# Patient Record
Sex: Female | Born: 1965 | Race: White | Hispanic: No | Marital: Married | Smoking: Former smoker
Health system: Southern US, Community
[De-identification: ages and names within clinical notes are randomized; demographics above are authoritative.]

## PROBLEM LIST (undated history)

## (undated) DIAGNOSIS — K645 Perianal venous thrombosis: Secondary | ICD-10-CM

## (undated) DIAGNOSIS — F909 Attention-deficit hyperactivity disorder, unspecified type: Secondary | ICD-10-CM

## (undated) DIAGNOSIS — F988 Other specified behavioral and emotional disorders with onset usually occurring in childhood and adolescence: Secondary | ICD-10-CM

## (undated) DIAGNOSIS — K602 Anal fissure, unspecified: Secondary | ICD-10-CM

## (undated) HISTORY — PX: LAPAROSCOPY: SHX197

## (undated) HISTORY — DX: Anal fissure, unspecified: K60.2

## (undated) HISTORY — PX: ABDOMINAL HYSTERECTOMY: SHX81

## (undated) HISTORY — DX: Perianal venous thrombosis: K64.5

---

## 2005-04-05 ENCOUNTER — Encounter: Admission: RE | Admit: 2005-04-05 | Discharge: 2005-04-05 | Payer: Self-pay | Admitting: Family Medicine

## 2006-03-19 ENCOUNTER — Other Ambulatory Visit: Admission: RE | Admit: 2006-03-19 | Discharge: 2006-03-19 | Payer: Self-pay | Admitting: Gynecology

## 2007-08-09 ENCOUNTER — Emergency Department (HOSPITAL_COMMUNITY): Admission: EM | Admit: 2007-08-09 | Discharge: 2007-08-09 | Payer: Self-pay | Admitting: Emergency Medicine

## 2009-01-21 HISTORY — PX: LAPAROSCOPY: SHX197

## 2009-08-02 ENCOUNTER — Ambulatory Visit: Payer: Self-pay | Admitting: Diagnostic Radiology

## 2009-08-02 ENCOUNTER — Emergency Department (HOSPITAL_BASED_OUTPATIENT_CLINIC_OR_DEPARTMENT_OTHER): Admission: EM | Admit: 2009-08-02 | Discharge: 2009-08-02 | Payer: Self-pay | Admitting: Emergency Medicine

## 2009-08-08 ENCOUNTER — Ambulatory Visit (HOSPITAL_COMMUNITY): Admission: RE | Admit: 2009-08-08 | Discharge: 2009-08-08 | Payer: Self-pay | Admitting: Obstetrics and Gynecology

## 2010-01-09 IMAGING — CT CT PELVIS W/ CM
2 of 4 series · 17 of 46 positions shown, 19 images · IV contrast (APPLIED)
Comparison: None

CLINICAL DATA: Pelvic pain.  Rectal bleeding.

CT PELVIS WITH CONTRAST
TECHNIQUE: Multidetector CT imaging of the pelvis was performed
following the standard protocol during administration of
intravenous contrast.
Contrast: 125 ml [id number]

[Series 2: abd_pel 5.0 b40f st · axial · 0.73mm/px · z∈[-476,-241]mm · 14 of 53 slices shown, 16 images]
[im 3/53  soft-tissue]
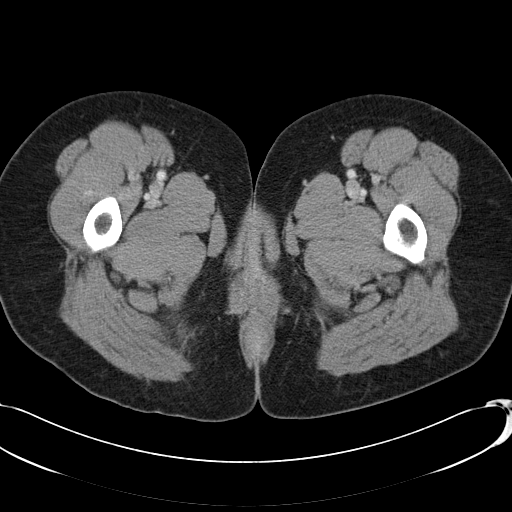
[im 3/53  bone]
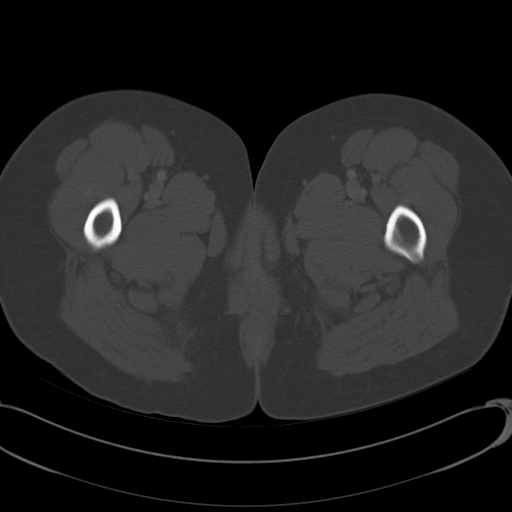
[im 8/53  soft-tissue]
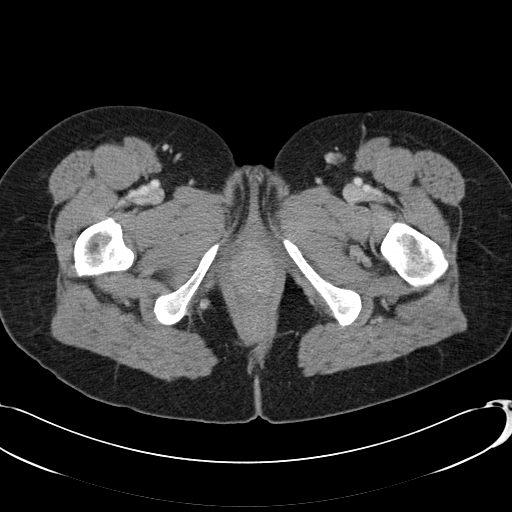
[im 11/53  soft-tissue]
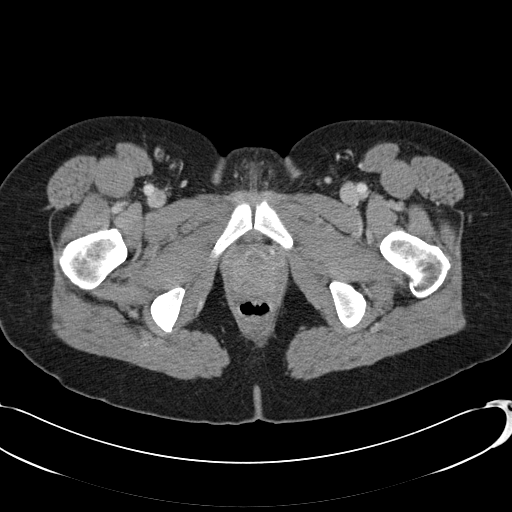
[im 14/53  soft-tissue]
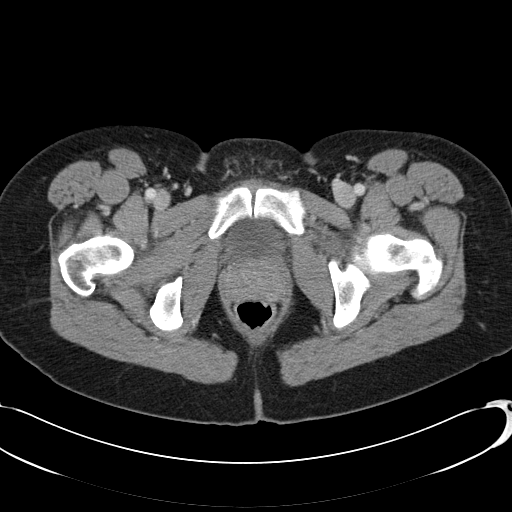
[im 19/53  soft-tissue]
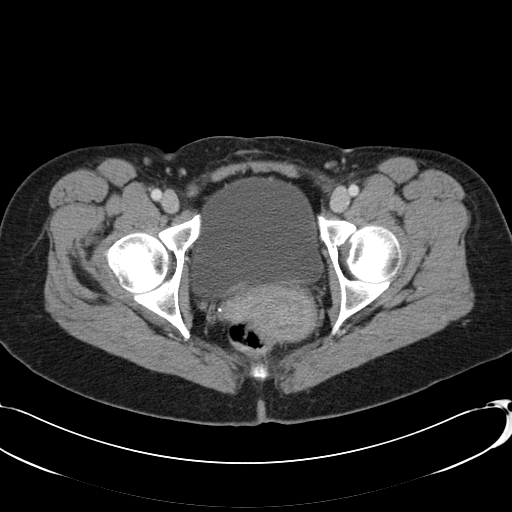
[im 21/53  soft-tissue]
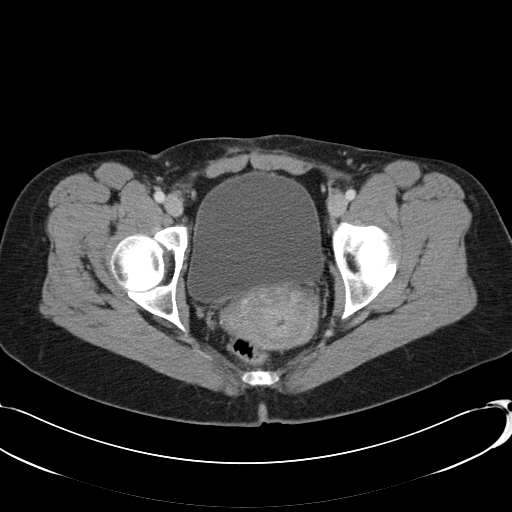
[im 24/53  soft-tissue]
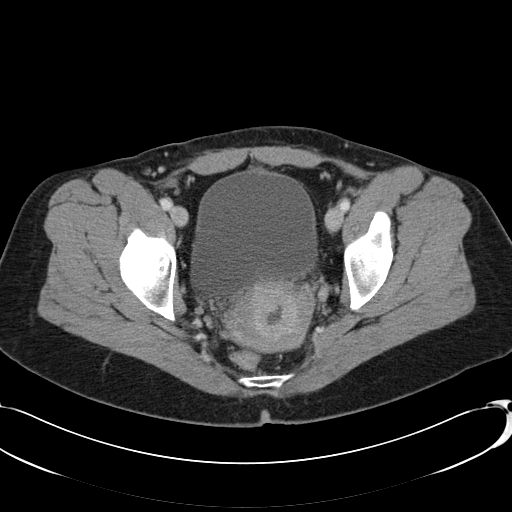
[im 29/53  soft-tissue]
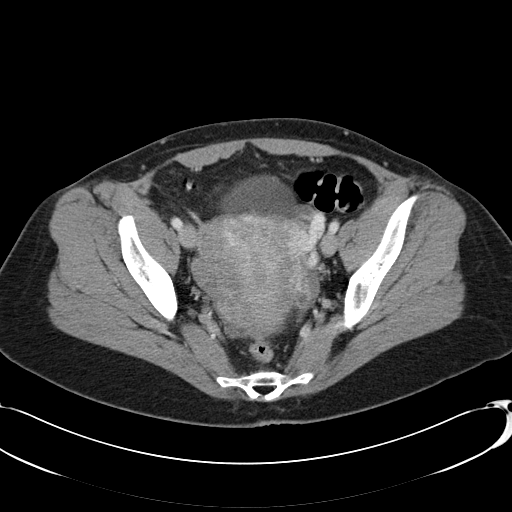
[im 32/53  soft-tissue]
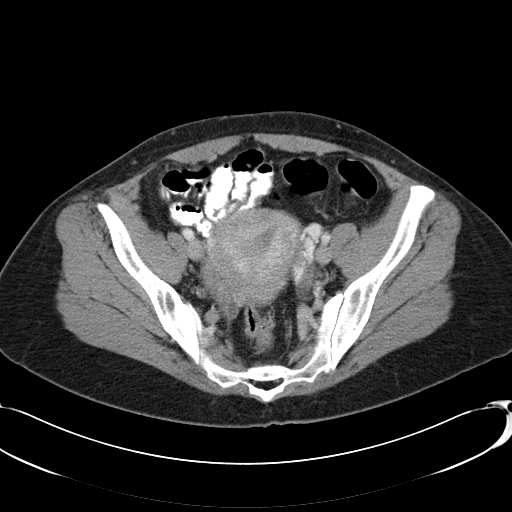
[im 32/53  bone]
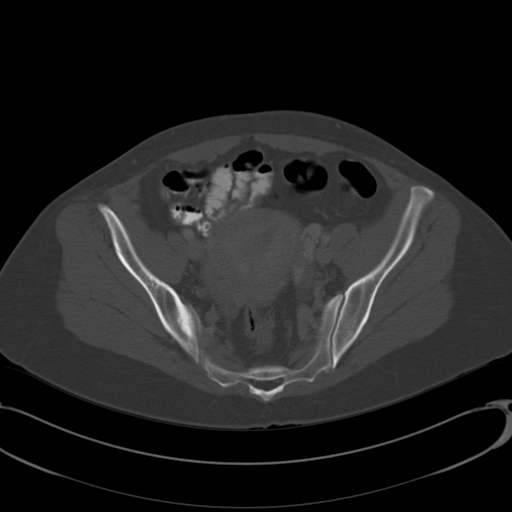
[im 34/53  soft-tissue]
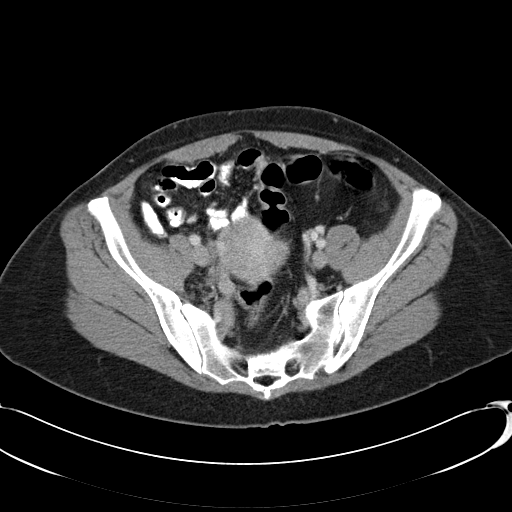
[im 40/53  soft-tissue]
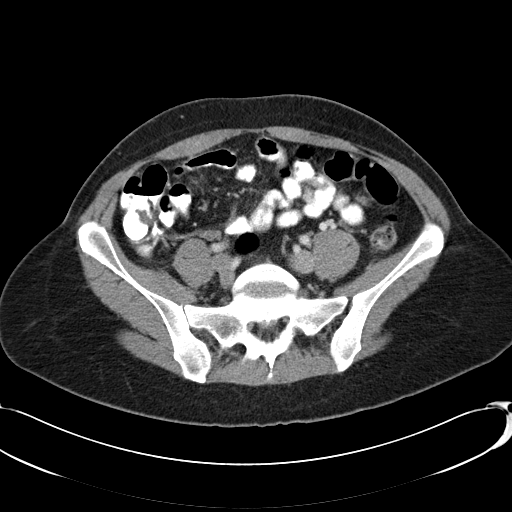
[im 42/53  soft-tissue]
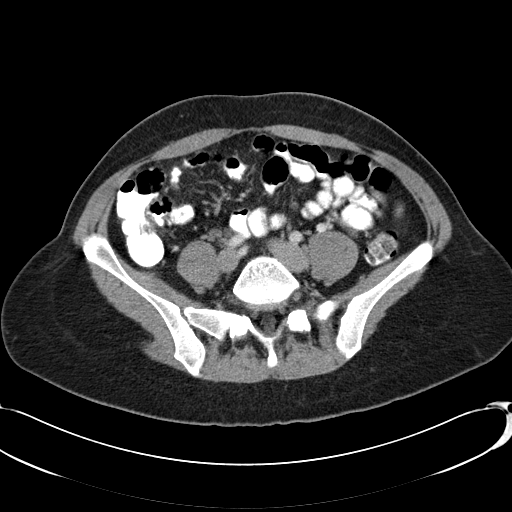
[im 45/53  soft-tissue]
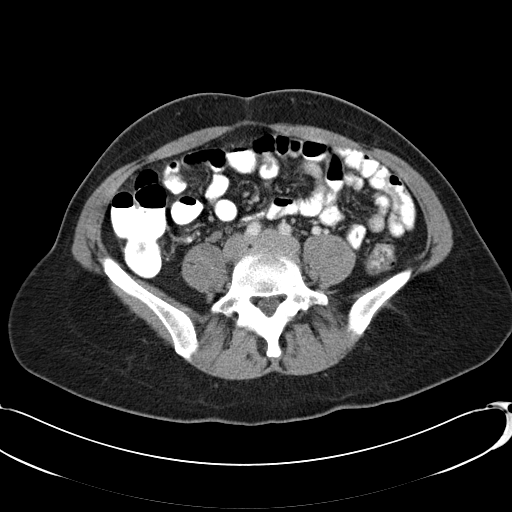
[im 50/53  soft-tissue]
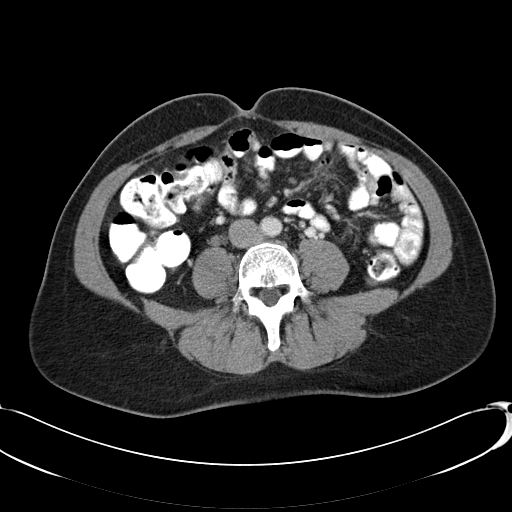

[Series 602: coronal · coronal · 0.73mm/px · 3 of 64 slices shown]
[im 22/64  soft-tissue]
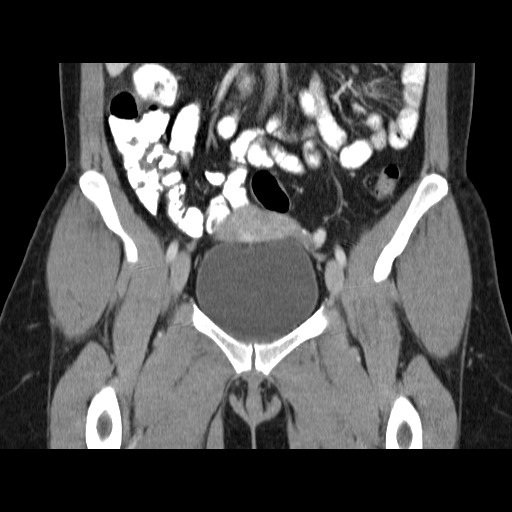
[im 29/64  soft-tissue]
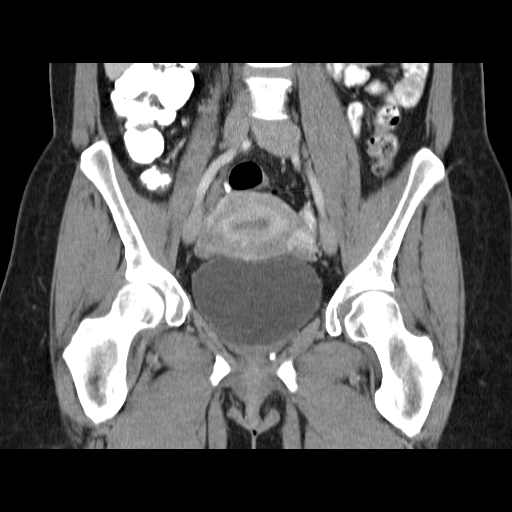
[im 36/64  soft-tissue]
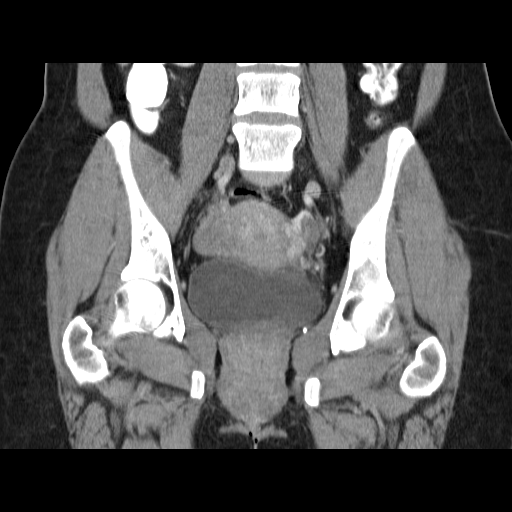

[17 of 46 positions shown; findings below may reference images not displayed]

FINDINGS: The rectum and sigmoid colon are unremarkable.  No
findings to suggest a

Bowl abscess or rectal mass.  The visualized small bowel loops
appear normal.  The uterus and ovaries are unremarkable.  The
bladder is normal.  A C-section scar is noted.  There is a small
amount of fluid in the endometrial canal and endocervical canal.

No pelvic adenopathy.  No significant free pelvic fluid
collections.  The bony pelvis is intact.  No inguinal masses or
hernia.
IMPRESSION: 1.  Unremarkable CT examination of the pelvis.

## 2010-02-16 ENCOUNTER — Encounter
Admission: RE | Admit: 2010-02-16 | Discharge: 2010-02-16 | Payer: Self-pay | Source: Home / Self Care | Attending: Obstetrics and Gynecology | Admitting: Obstetrics and Gynecology

## 2010-04-07 LAB — CBC
Hemoglobin: 12.6 g/dL (ref 12.0–15.0)
MCHC: 34.3 g/dL (ref 30.0–36.0)
WBC: 5.4 10*3/uL (ref 4.0–10.5)

## 2010-04-08 LAB — WET PREP, GENITAL: Trich, Wet Prep: NONE SEEN

## 2010-04-08 LAB — DIFFERENTIAL
Basophils Absolute: 0.1 10*3/uL (ref 0.0–0.1)
Lymphs Abs: 1.8 10*3/uL (ref 0.7–4.0)
Neutrophils Relative %: 74 % (ref 43–77)

## 2010-04-08 LAB — CBC
HCT: 38.2 % (ref 36.0–46.0)
Hemoglobin: 13.2 g/dL (ref 12.0–15.0)
MCHC: 34.5 g/dL (ref 30.0–36.0)
RDW: 11.7 % (ref 11.5–15.5)

## 2010-04-08 LAB — URINALYSIS, ROUTINE W REFLEX MICROSCOPIC
Bilirubin Urine: NEGATIVE
Glucose, UA: NEGATIVE mg/dL
Hgb urine dipstick: NEGATIVE
Protein, ur: NEGATIVE mg/dL
Specific Gravity, Urine: 1.005 (ref 1.005–1.030)
Urobilinogen, UA: 0.2 mg/dL (ref 0.0–1.0)
pH: 6.5 (ref 5.0–8.0)

## 2010-04-08 LAB — PREGNANCY, URINE: Preg Test, Ur: NEGATIVE

## 2010-09-03 ENCOUNTER — Encounter (HOSPITAL_COMMUNITY): Payer: Self-pay

## 2010-09-03 ENCOUNTER — Encounter (HOSPITAL_COMMUNITY)
Admission: RE | Admit: 2010-09-03 | Discharge: 2010-09-03 | Disposition: A | Discharge status: Home / Self Care | Payer: BC Managed Care – PPO | Source: Ambulatory Visit | Attending: Obstetrics and Gynecology | Admitting: Obstetrics and Gynecology

## 2010-09-03 HISTORY — DX: Attention-deficit hyperactivity disorder, unspecified type: F90.9

## 2010-09-03 LAB — CBC
HCT: 39.7 % (ref 36.0–46.0)
RBC: 4.3 MIL/uL (ref 3.87–5.11)
WBC: 6.6 10*3/uL (ref 4.0–10.5)

## 2010-09-03 LAB — SURGICAL PCR SCREEN
MRSA, PCR: NEGATIVE
Staphylococcus aureus: POSITIVE — AB

## 2010-09-03 NOTE — H&P (Signed)
  Meredith [name]  DICTATION # [id number] CSN# [id number]   [name], MD 09/03/2010 1:21 PM

## 2010-09-03 NOTE — Anesthesia Preprocedure Evaluation (Signed)
Anesthesia Evaluation  Name, MR# and DOB Patient awake  General Assessment Comment  Reviewed: Allergy & Precautions, H&P , Patient's Chart, lab work & pertinent test results, reviewed documented beta blocker date and time   History of Anesthesia Complications Negative for: history of anesthetic complications  Airway Mallampati: II TM Distance: >3 FB Neck ROM: full    Dental No notable dental hx.    Pulmonary  clear to auscultation  pulmonary exam normalPulmonary Exam Normal breath sounds clear to auscultation none    Cardiovascular Exercise Tolerance: Good regular Normal    Neuro/Psych Negative Neurological ROS  Negative Psych ROS  GI/Hepatic/Renal negative GI ROS, negative Liver ROS, and negative Renal ROS (+)       Endo/Other  Negative Endocrine ROS (+)      Abdominal   Musculoskeletal   Hematology negative hematology ROS (+)   Peds  Reproductive/Obstetrics negative OB ROS    Anesthesia Other Findings ADHD on adderall            Anesthesia Physical Anesthesia Plan  ASA: II  Anesthesia Plan: General   Post-op Pain Management:    Induction:   Airway Management Planned:   Additional Equipment:   Intra-op Plan:   Post-operative Plan:   Informed Consent: I have reviewed the patients History and Physical, chart, labs and discussed the procedure including the risks, benefits and alternatives for the proposed anesthesia with the patient or authorized representative who has indicated his/her understanding and acceptance.   Dental Advisory Given  Plan Discussed with: CRNA and Surgeon  Anesthesia Plan Comments:         Anesthesia Quick Evaluation

## 2010-09-03 NOTE — Patient Instructions (Signed)
  Amilliana Hayworth Your procedure is scheduled ZO:[id number] 09/06/10 Enter through the Main Entrance of [facility] at:6 AM Pick up the phone a the desk and dial [phone] Please call this number if you have any problems the morning of surgery: [phone]  Remember: Do not eat food after midnight  Do not drink clear liquids after:midnight Take these medicines the morning of surgery with a SIP OF WATER:none Do not wear jewelry, make-up, or nail polish Do not wear lotions, powders, or perfumes. Do not shave 48 hours prior to surgery. Do not bring valuables to the hospital. Leave suitcase in the car. After Surgery it may be brought to your room. For patients being admitted to the hospital, checkout time is 11:00am the day of discharge.  Please read over the Hibiclens Guide to General Skin Cleansing at Home. Remember that hibiclens is not to be used on the head, face, or vaginal area. Please shower with 1/2 bottle the evening before surgery and other 1/2 bottle morning of surgery prior to arrival at hospital.  Do not use any deodorants, lotions, or powders after hibiclens shower.

## 2010-09-04 NOTE — H&P (Signed)
[name], [name]                ACCOUNT NO.:  [id number]  MEDICAL RECORD NO.:  [id number]  LOCATION:                                 FACILITY:  PHYSICIAN:  Duke Salvia. Marcelle Overlie, M.D.DATE OF BIRTH:  [birth year]  DATE OF ADMISSION:  09/06/2010 DATE OF DISCHARGE:                             HISTORY & PHYSICAL   Date of scheduled surgery is September 06, 2010.  CHIEF COMPLAINT:  Menorrhagia, stress incontinence.  HISTORY OF PRESENT ILLNESS:  A 45 year old G3, P2, two prior C-sections has 37-year-old twins and a 10-year-old, prior tubal ligation.  Last year she had laparoscopy to evaluate pelvic pain, had some minimal adhesions, possible adenomyosis with some minimal endometriosis noted.  Due to concerns with menorrhagia and stress urinary incontinence, she presents for LAVH with Solyx single incision sling.  She has had preoperative urodynamics consistent with SUI.  The procedure of LAVH including specific risks related to bleeding, infection, transfusion, adjacent organ injury, the possible need to complete the surgery by open technique all reviewed.  Additionally, the small risk of mesh erosion other risks from the sling procedure including adjacent organ injury, the need for catheterization postoperatively reviewed with her which she understands and accepts.  PAST MEDICAL HISTORY:  None.  ALLERGIES:  None.  CURRENT MEDICATIONS:  Adderall.  OBSTETRICAL HISTORY:  Twins in 2004, another delivery in 2005, both by C- sections previous laparoscopy last year is noted.  FAMILY HISTORY:  She is adopted.  SOCIAL HISTORY:  Denies smoking or alcohol use.  She is married.  PHYSICAL EXAM:  VITAL SIGNS:  Temperature 98.2, blood pressure 120/82. HEENT:  Unremarkable. NECK:  Supple without masses. LUNGS:  Clear. CARDIOVASCULAR:  Rate and rhythm without murmurs, rubs, or gallops. BREASTS:  Without masses. ABDOMEN:  Soft, flat, and nontender. PELVIC:  Normal external genitalia.  Vagina and  cervix clear.  Uterus upper limit of normal size, mid position, mobile.  Adnexa negative. EXTREMITIES:  Unremarkable. NEUROLOGIC:  Unremarkable.  IMPRESSION: 1. Stress urinary incontinence. 2. Menorrhagia, possible adenomyosis, minimal endometriosis prior     laparoscopy.  PLAN:  LAVH single incision, mid urethral sling procedure and risks reviewed as above.     Dominico Rod M. Marcelle Overlie, M.D.     [facility]/MEDQ  D:  09/03/2010  T:  09/03/2010  Job:  [id number]

## 2010-09-05 ENCOUNTER — Other Ambulatory Visit: Payer: Self-pay | Admitting: Obstetrics and Gynecology

## 2010-09-06 ENCOUNTER — Ambulatory Visit (HOSPITAL_COMMUNITY): Payer: BC Managed Care – PPO | Admitting: Certified Registered"

## 2010-09-06 ENCOUNTER — Ambulatory Visit (HOSPITAL_COMMUNITY)
Admission: RE | Admit: 2010-09-06 | Discharge: 2010-09-07 | Disposition: A | Discharge status: Home / Self Care | Payer: BC Managed Care – PPO | Source: Ambulatory Visit | Attending: Obstetrics and Gynecology | Admitting: Obstetrics and Gynecology

## 2010-09-06 ENCOUNTER — Encounter (HOSPITAL_COMMUNITY): Payer: Self-pay | Admitting: Certified Registered"

## 2010-09-06 ENCOUNTER — Other Ambulatory Visit: Payer: Self-pay | Admitting: Obstetrics and Gynecology

## 2010-09-06 ENCOUNTER — Encounter (HOSPITAL_COMMUNITY)
Admission: RE | Disposition: A | Discharge status: Home / Self Care | Payer: Self-pay | Source: Ambulatory Visit | Attending: Obstetrics and Gynecology

## 2010-09-06 ENCOUNTER — Encounter (HOSPITAL_COMMUNITY): Payer: Self-pay | Admitting: Anesthesiology

## 2010-09-06 DIAGNOSIS — Z01818 Encounter for other preprocedural examination: Secondary | ICD-10-CM | POA: Insufficient documentation

## 2010-09-06 DIAGNOSIS — Z01812 Encounter for preprocedural laboratory examination: Secondary | ICD-10-CM | POA: Insufficient documentation

## 2010-09-06 DIAGNOSIS — N393 Stress incontinence (female) (male): Secondary | ICD-10-CM | POA: Insufficient documentation

## 2010-09-06 DIAGNOSIS — N920 Excessive and frequent menstruation with regular cycle: Secondary | ICD-10-CM | POA: Insufficient documentation

## 2010-09-06 HISTORY — PX: BLADDER SUSPENSION: SHX72

## 2010-09-06 HISTORY — PX: LAPAROSCOPIC ASSISTED VAGINAL HYSTERECTOMY: SHX5398

## 2010-09-06 SURGERY — HYSTERECTOMY, VAGINAL, LAPAROSCOPY-ASSISTED
Anesthesia: General | Site: Vagina | Laterality: Bilateral | Wound class: Clean Contaminated

## 2010-09-06 MED ORDER — FENTANYL CITRATE 0.05 MG/ML IJ SOLN
INTRAMUSCULAR | Status: AC
  Filled 2010-09-06: qty 5

## 2010-09-06 MED ORDER — KETOROLAC TROMETHAMINE 30 MG/ML IJ SOLN: 30.0000 mg | Freq: Once | INTRAMUSCULAR | Status: DC

## 2010-09-06 MED ORDER — KETOROLAC TROMETHAMINE 30 MG/ML IJ SOLN
30.0000 mg | Freq: Four times a day (QID) | INTRAMUSCULAR | Status: DC
  Filled 2010-09-06: qty 1

## 2010-09-06 MED ORDER — FENTANYL CITRATE 0.05 MG/ML IJ SOLN
INTRAMUSCULAR | Status: DC | PRN
  Administered 2010-09-06: 100 ug via INTRAVENOUS
  Administered 2010-09-06: 50 ug via INTRAVENOUS
  Administered 2010-09-06: 100 ug via INTRAVENOUS

## 2010-09-06 MED ORDER — DIPHENHYDRAMINE HCL 12.5 MG/5ML PO ELIX: 12.5000 mg | ORAL_SOLUTION | Freq: Four times a day (QID) | ORAL | Status: DC | PRN

## 2010-09-06 MED ORDER — ONDANSETRON HCL 4 MG/2ML IJ SOLN
INTRAMUSCULAR | Status: DC | PRN
  Administered 2010-09-06 (×2): 4 mg via INTRAVENOUS

## 2010-09-06 MED ORDER — PHENYLEPHRINE HCL 10 MG/ML IJ SOLN
INTRAMUSCULAR | Status: DC | PRN
  Administered 2010-09-06: 40 ug via INTRAVENOUS
  Administered 2010-09-06: 80 ug via INTRAVENOUS
  Administered 2010-09-06: 40 ug via INTRAVENOUS

## 2010-09-06 MED ORDER — SIMETHICONE 80 MG PO CHEW: 80.0000 mg | CHEWABLE_TABLET | Freq: Four times a day (QID) | ORAL | Status: DC | PRN

## 2010-09-06 MED ORDER — DEXAMETHASONE SODIUM PHOSPHATE 10 MG/ML IJ SOLN
INTRAMUSCULAR | Status: AC
  Filled 2010-09-06: qty 1

## 2010-09-06 MED ORDER — MORPHINE SULFATE 10 MG/ML IJ SOLN
INTRAMUSCULAR | Status: AC
  Filled 2010-09-06: qty 1

## 2010-09-06 MED ORDER — CEFAZOLIN SODIUM 1-5 GM-% IV SOLN
1.0000 g | INTRAVENOUS | Status: AC
  Administered 2010-09-06: 1 g via INTRAVENOUS

## 2010-09-06 MED ORDER — MIDAZOLAM HCL 2 MG/2ML IJ SOLN
INTRAMUSCULAR | Status: AC
  Filled 2010-09-06: qty 2

## 2010-09-06 MED ORDER — LIDOCAINE HCL (PF) 2 % IJ SOLN
INTRAMUSCULAR | Status: DC | PRN
  Administered 2010-09-06: 60 mg

## 2010-09-06 MED ORDER — MORPHINE SULFATE (PF) 1 MG/ML IV SOLN
INTRAVENOUS | Status: DC
  Administered 2010-09-06: 25 mg via INTRAVENOUS
  Administered 2010-09-06: 12 mL via INTRAVENOUS
  Administered 2010-09-06: 15 mg via INTRAVENOUS
  Administered 2010-09-06: 25 mg via INTRAVENOUS
  Administered 2010-09-06: 11 mL via INTRAVENOUS
  Filled 2010-09-06 (×2): qty 25

## 2010-09-06 MED ORDER — BUPIVACAINE HCL (PF) 0.25 % IJ SOLN
INTRAMUSCULAR | Status: DC | PRN
  Administered 2010-09-06: 9 mL

## 2010-09-06 MED ORDER — DEXAMETHASONE SODIUM PHOSPHATE 10 MG/ML IJ SOLN
INTRAMUSCULAR | Status: DC | PRN
  Administered 2010-09-06: 4 mg via INTRAVENOUS

## 2010-09-06 MED ORDER — NEOSTIGMINE METHYLSULFATE 1 MG/ML IJ SOLN
INTRAMUSCULAR | Status: AC
  Filled 2010-09-06: qty 10

## 2010-09-06 MED ORDER — LACTATED RINGERS IR SOLN
Status: DC | PRN
  Administered 2010-09-06: 3000 mL

## 2010-09-06 MED ORDER — MEPERIDINE HCL 25 MG/ML IJ SOLN
INTRAMUSCULAR | Status: AC
  Administered 2010-09-06: 6.25 mg via INTRAVENOUS
  Filled 2010-09-06: qty 1

## 2010-09-06 MED ORDER — IBUPROFEN 200 MG PO TABS: 200.0000 mg | ORAL_TABLET | Freq: Four times a day (QID) | ORAL | Status: DC | PRN

## 2010-09-06 MED ORDER — ROCURONIUM BROMIDE 100 MG/10ML IV SOLN
INTRAVENOUS | Status: DC | PRN
  Administered 2010-09-06: 40 mg via INTRAVENOUS

## 2010-09-06 MED ORDER — MENTHOL 3 MG MT LOZG: 1.0000 | LOZENGE | OROMUCOSAL | Status: DC | PRN

## 2010-09-06 MED ORDER — LIDOCAINE-EPINEPHRINE 1 %-1:100000 IJ SOLN
INTRAMUSCULAR | Status: DC | PRN
  Administered 2010-09-06: 3 mL via INTRADERMAL

## 2010-09-06 MED ORDER — DEXTROSE IN LACTATED RINGERS 5 % IV SOLN
INTRAVENOUS | Status: DC
  Administered 2010-09-06 – 2010-09-07 (×2): via INTRAVENOUS

## 2010-09-06 MED ORDER — SUCCINYLCHOLINE CHLORIDE 20 MG/ML IJ SOLN
INTRAMUSCULAR | Status: AC
  Filled 2010-09-06: qty 1

## 2010-09-06 MED ORDER — BUTORPHANOL TARTRATE 2 MG/ML IJ SOLN: 1.0000 mg | INTRAMUSCULAR | Status: DC | PRN

## 2010-09-06 MED ORDER — KETOROLAC TROMETHAMINE 30 MG/ML IJ SOLN
30.0000 mg | Freq: Four times a day (QID) | INTRAMUSCULAR | Status: DC
  Administered 2010-09-06 – 2010-09-07 (×4): 30 mg via INTRAVENOUS
  Filled 2010-09-06 (×3): qty 1

## 2010-09-06 MED ORDER — HYDROMORPHONE HCL 1 MG/ML IJ SOLN
INTRAMUSCULAR | Status: AC
  Administered 2010-09-06: 0.5 mg via INTRAVENOUS
  Filled 2010-09-06: qty 1

## 2010-09-06 MED ORDER — PROPOFOL 10 MG/ML IV EMUL
INTRAVENOUS | Status: AC
  Filled 2010-09-06: qty 20

## 2010-09-06 MED ORDER — OXYCODONE-ACETAMINOPHEN 5-325 MG PO TABS: 1.0000 | ORAL_TABLET | ORAL | Status: DC | PRN

## 2010-09-06 MED ORDER — STERILE WATER FOR IRRIGATION IR SOLN
Status: DC | PRN
  Administered 2010-09-06: 200 mL

## 2010-09-06 MED ORDER — ROCURONIUM BROMIDE 50 MG/5ML IV SOLN
INTRAVENOUS | Status: AC
  Filled 2010-09-06: qty 1

## 2010-09-06 MED ORDER — NALOXONE HCL 0.4 MG/ML IJ SOLN: 0.4000 mg | INTRAMUSCULAR | Status: DC | PRN

## 2010-09-06 MED ORDER — DEXTROSE IN LACTATED RINGERS 5 % IV SOLN: INTRAVENOUS | Status: DC

## 2010-09-06 MED ORDER — ONDANSETRON HCL 4 MG/2ML IJ SOLN
INTRAMUSCULAR | Status: AC
  Filled 2010-09-06: qty 2

## 2010-09-06 MED ORDER — GLYCOPYRROLATE 0.2 MG/ML IJ SOLN
INTRAMUSCULAR | Status: DC | PRN
  Administered 2010-09-06: .15 mg via INTRAVENOUS
  Administered 2010-09-06: .5 mg via INTRAVENOUS

## 2010-09-06 MED ORDER — MEPERIDINE HCL 25 MG/ML IJ SOLN
6.2500 mg | INTRAMUSCULAR | Status: DC | PRN
  Administered 2010-09-06 (×2): 6.25 mg via INTRAVENOUS

## 2010-09-06 MED ORDER — SODIUM CHLORIDE 0.9 % IJ SOLN
INTRAMUSCULAR | Status: DC | PRN
  Administered 2010-09-06: 10 mL

## 2010-09-06 MED ORDER — MIDAZOLAM HCL 5 MG/5ML IJ SOLN
INTRAMUSCULAR | Status: DC | PRN
  Administered 2010-09-06: 2 mg via INTRAVENOUS

## 2010-09-06 MED ORDER — ONDANSETRON HCL 4 MG/2ML IJ SOLN: 4.0000 mg | Freq: Once | INTRAMUSCULAR | Status: DC | PRN

## 2010-09-06 MED ORDER — DIPHENHYDRAMINE HCL 50 MG/ML IJ SOLN: 12.5000 mg | Freq: Four times a day (QID) | INTRAMUSCULAR | Status: DC | PRN

## 2010-09-06 MED ORDER — IBUPROFEN 800 MG PO TABS: 800.0000 mg | ORAL_TABLET | Freq: Three times a day (TID) | ORAL | Status: DC | PRN

## 2010-09-06 MED ORDER — BISACODYL 10 MG RE SUPP: 10.0000 mg | Freq: Every day | RECTAL | Status: DC | PRN

## 2010-09-06 MED ORDER — LACTATED RINGERS IV SOLN
INTRAVENOUS | Status: DC
  Administered 2010-09-06: 50 mL/h via INTRAVENOUS
  Administered 2010-09-06 (×2): via INTRAVENOUS

## 2010-09-06 MED ORDER — NEOSTIGMINE METHYLSULFATE 1 MG/ML IJ SOLN
INTRAMUSCULAR | Status: DC | PRN
  Administered 2010-09-06: 3 mg via INTRAMUSCULAR

## 2010-09-06 MED ORDER — HYDROMORPHONE HCL 1 MG/ML IJ SOLN
0.2500 mg | INTRAMUSCULAR | Status: DC | PRN
  Administered 2010-09-06 (×2): 0.5 mg via INTRAVENOUS

## 2010-09-06 MED ORDER — GLYCOPYRROLATE 0.2 MG/ML IJ SOLN
INTRAMUSCULAR | Status: AC
  Filled 2010-09-06: qty 1

## 2010-09-06 MED ORDER — LIDOCAINE HCL (CARDIAC) 20 MG/ML IV SOLN
INTRAVENOUS | Status: AC
  Filled 2010-09-06: qty 5

## 2010-09-06 MED ORDER — PHENYLEPHRINE 40 MCG/ML (10ML) SYRINGE FOR IV PUSH (FOR BLOOD PRESSURE SUPPORT)
PREFILLED_SYRINGE | INTRAVENOUS | Status: AC
  Filled 2010-09-06: qty 5

## 2010-09-06 MED ORDER — PROPOFOL 10 MG/ML IV EMUL
INTRAVENOUS | Status: DC | PRN
  Administered 2010-09-06: 160 mg via INTRAVENOUS
  Administered 2010-09-06: 40 mg via INTRAVENOUS

## 2010-09-06 MED ORDER — ZOLPIDEM TARTRATE 5 MG PO TABS
5.0000 mg | ORAL_TABLET | Freq: Every evening | ORAL | Status: DC | PRN
  Administered 2010-09-07: 5 mg via ORAL
  Filled 2010-09-06: qty 1

## 2010-09-06 MED ORDER — KETOROLAC TROMETHAMINE 30 MG/ML IJ SOLN
INTRAMUSCULAR | Status: DC | PRN
  Administered 2010-09-06: 30 mg via INTRAVENOUS

## 2010-09-06 SURGICAL SUPPLY — 54 items
BLADE SURG 11 STRL SS (BLADE) IMPLANT
BLADE SURG 15 STRL LF C SS BP (BLADE) ×2 IMPLANT
BLADE SURG 15 STRL SS (BLADE) ×1
CABLE HIGH FREQUENCY MONO STRZ (ELECTRODE) IMPLANT
CANISTER SUCTION 2500CC (MISCELLANEOUS) ×3 IMPLANT
CATH ROBINSON RED A/P 16FR (CATHETERS) ×3 IMPLANT
CLOTH BEACON ORANGE TIMEOUT ST (SAFETY) ×3 IMPLANT
CONT PATH 16OZ SNAP LID 3702 (MISCELLANEOUS) ×3 IMPLANT
COVER TABLE BACK 60X90 (DRAPES) ×3 IMPLANT
DECANTER SPIKE VIAL GLASS SM (MISCELLANEOUS) ×6 IMPLANT
DERMABOND ADVANCED (GAUZE/BANDAGES/DRESSINGS) ×6 IMPLANT
DRAPE CAMERA CLOSED 9X96 (DRAPES) ×3 IMPLANT
DRAPE HYSTEROSCOPY (DRAPE) ×3 IMPLANT
DRAPE UTILITY XL STRL (DRAPES) ×3 IMPLANT
DRSG COVADERM PLUS 2X2 (GAUZE/BANDAGES/DRESSINGS) ×3 IMPLANT
ELECT LIGASURE LONG (ELECTRODE) ×3 IMPLANT
ELECT REM PT RETURN 9FT ADLT (ELECTROSURGICAL) ×3
ELECTRODE REM PT RTRN 9FT ADLT (ELECTROSURGICAL) ×2 IMPLANT
GAUZE PACKING 1 X5 YD ST (GAUZE/BANDAGES/DRESSINGS) IMPLANT
GAUZE SPONGE 4X4 16PLY XRAY LF (GAUZE/BANDAGES/DRESSINGS) ×6 IMPLANT
GENERIC IMPLANT FOR PICIS (MISCELLANEOUS) ×3 IMPLANT
GLOVE BIO SURGEON STRL SZ7 (GLOVE) ×9 IMPLANT
GLOVE BIOGEL PI IND STRL 6.5 (GLOVE) ×2 IMPLANT
GLOVE BIOGEL PI INDICATOR 6.5 (GLOVE) ×1
GOWN PREVENTION PLUS LG XLONG (DISPOSABLE) ×12 IMPLANT
NEEDLE HYPO 25X1 1.5 SAFETY (NEEDLE) ×3 IMPLANT
NEEDLE INSUFFLATION 14GA 120MM (NEEDLE) ×3 IMPLANT
NS IRRIG 1000ML POUR BTL (IV SOLUTION) ×3 IMPLANT
PACK LAVH (CUSTOM PROCEDURE TRAY) ×3 IMPLANT
PACK VAGINAL WOMENS (CUSTOM PROCEDURE TRAY) ×3 IMPLANT
SEALER TISSUE G2 CVD JAW 45CM (ENDOMECHANICALS) ×3 IMPLANT
SET CYSTO W/LG BORE CLAMP LF (SET/KITS/TRAYS/PACK) ×3 IMPLANT
SET IRRIG TUBING LAPAROSCOPIC (IRRIGATION / IRRIGATOR) ×3 IMPLANT
SLING SOLYX SYSTEM SIS EA (Sling) ×3 IMPLANT
SOLUTION ELECTROLUBE (MISCELLANEOUS) IMPLANT
STRIP CLOSURE SKIN 1/4X3 (GAUZE/BANDAGES/DRESSINGS) IMPLANT
SUT MON AB 2-0 CT1 36 (SUTURE) ×9 IMPLANT
SUT VIC AB 0 CT1 18XCR BRD8 (SUTURE) ×4 IMPLANT
SUT VIC AB 0 CT1 8-18 (SUTURE) ×2
SUT VIC AB 2-0 CT1 27 (SUTURE) ×2
SUT VIC AB 2-0 CT1 TAPERPNT 27 (SUTURE) ×4 IMPLANT
SUT VIC AB 2-0 SH 27 (SUTURE) ×1
SUT VIC AB 2-0 SH 27XBRD (SUTURE) ×2 IMPLANT
SUT VIC AB 2-0 UR6 27 (SUTURE) ×6 IMPLANT
SUT VICRYL 0 TIES 12 18 (SUTURE) ×3 IMPLANT
SUT VICRYL 4-0 PS2 18IN ABS (SUTURE) ×6 IMPLANT
SYR CONTROL 10ML LL (SYRINGE) ×6 IMPLANT
TOWEL OR 17X24 6PK STRL BLUE (TOWEL DISPOSABLE) ×6 IMPLANT
TRAY FOLEY CATH 14FR (SET/KITS/TRAYS/PACK) ×3 IMPLANT
TROCAR Z-THREAD BLADED 11X100M (TROCAR) ×3 IMPLANT
TROCAR Z-THREAD BLADED 5X100MM (TROCAR) ×6 IMPLANT
TUBING NON-CON 1/4 X 20 CONN (TUBING) ×3 IMPLANT
WARMER LAPAROSCOPE (MISCELLANEOUS) ×3 IMPLANT
WATER STERILE IRR 1000ML POUR (IV SOLUTION) ×3 IMPLANT

## 2010-09-06 NOTE — Anesthesia Postprocedure Evaluation (Signed)
Anesthesia Post Note  Patient: Meredith [name]  Procedure(s) Performed:  LAPAROSCOPIC ASSISTED VAGINAL HYSTERECTOMY - abdomen/vagina; TRANSVAGINAL TAPE (TVT) PROCEDURE - solyx  Anesthesia type: General  Patient location: PACU  Post pain: Pain level controlled  Post assessment: Post-op Vital signs reviewed  Last Vitals:  Filed Vitals:   09/06/10 0915  BP: 90/71  Pulse: 55  Temp:   Resp: 12    Post vital signs: Reviewed  Level of consciousness: sedated  Complications: No apparent anesthesia complicationsfj

## 2010-09-06 NOTE — Progress Notes (Signed)
No change in status.

## 2010-09-06 NOTE — Preoperative (Signed)
Beta Blockers   Reason not to administer Beta Blockers:Not Applicable 

## 2010-09-06 NOTE — Transfer of Care (Signed)
Immediate Anesthesia Transfer of Care Note  Patient: Meredith [name]  Procedure(s) Performed:  LAPAROSCOPIC ASSISTED VAGINAL HYSTERECTOMY - abdomen/vagina; TRANSVAGINAL TAPE (TVT) PROCEDURE - solyx  Patient Location: PACU  Anesthesia Type: General  Level of Consciousness: awake, alert  and oriented  Airway & Oxygen Therapy: Patient Spontanous Breathing and Patient connected to nasal cannula oxygen  Post-op Assessment: Report given to PACU RN and Post -op Vital signs reviewed and stable  Post vital signs: Reviewed and stable  Complications: No apparent anesthesia complications

## 2010-09-06 NOTE — OR Nursing (Signed)
Solyx sling implanted @ midurethral position in OR suite per Dr. Marcelle Overlie. Serial # [id number]. Exp. Date 11/21/12.

## 2010-09-06 NOTE — Progress Notes (Signed)
Alert + conversant, clear UOP, abd benign, reviewed procedure.

## 2010-09-06 NOTE — Op Note (Signed)
Preoperative diagnosis: 1. Menorrhagia, dysmenorrhea 2. Stress urinary incontinence. 3. Telemetry can endometriosis.  Postoperative diagnosis: Same  Procedure: LAVH, silex, single incision mid urethral sling, cystoscopy.  Surgeon: Marcelle Overlie  Assistant: Huntley Dec  EBL: 200 cc.  Specimens removed: Uterus to pathology  Complications: None  Procedure and findings:  Patient was taken to the operating room, after an adequate level of general endotracheal anesthesia was obtained with the patient in the supine position, legs in stirrups the abdomen perineum and vagina were prepped and draped in the usual manner for LAVH. In and out catheter into the bladder, uterus midposition normal size mobile adnexa negative. Holter tenaculum was positioned. Attention directed to the abdomen where 2 cm subumbilical incision was made after infiltrating with quarter percent Marcaine plain. The varies needle was introduced without difficulty. Its intra-abdominal position was verified by pressure water testing. After a 2 and half liters pneumoperitoneum was then created, laparoscopic trocar and sleeve were then introduced without difficulty. There was no evidence of any bleeding or trauma.  3 finger breaths above the symphysis in the midline a 5 mm trocar was inserted under direct visualization. The patient in Trendelenburg and the uterus anteflexed the pelvic findings as follows:   The uterus itself was normal size there were some spotty endometriosis on the posterior surface of the uterus bilateral adnexa were unremarkable the appendix liver and gallbladder appeared to be no abnormal no other abnormalities were noted. Starting on the right using the in seal device the utero-ovarian pedicle was coagulated and divided down to and including the round ligament on each side with excellent hemostasis. This point the vaginal portion of the procedure was started.  The legs were extended weighted speculum was positioned cervix  grasped with a tenaculum the cervical vaginal mucosa was coagulated and cut with the Bovie posterior culdotomy performed without difficulty. The bladder was advanced superiorly with sharp and blunt dissection until the anterior peritoneal reflection could be identified this was entered sharply and a retractor then used to gently elevate the bladder out of the field. The LigaSure device was then used to coagulate and divide the uterosacral ligament close to the uterus, cardinal ligament, uterine vasculature pedicles and upper broad ligament pedicles. The fundus of the uterus was then delivered posteriorly remaining pedicles coagulated and divided. The vaginal cuff was then closed from right to left with a 2-0 Monocryl running locked suture. Prior to closure sponge needle history counts reported as correct x2 vaginal mucosa closed right to left with interrupted 2-0 Monocryl sutures. At that point the mid urethral sling portion the procedure was started Allis clamps were then used to grasp the mucosa over the mid urethra which was injected with 1% Xylocaine with epi. A 2 cm vertical incision was then made using minimal Metzenbaum dissection and blunt finger dissection the space was developed until the posterior portion of the inferior pubic ramus could be palpated on each side. The psoas was then anchored on the right at 45 angle to the midway point of the same on the left side, final tensioning based on a hemostat positioned at the mid point. Once this was Iker cystoscopy was carried out revealing normal bladder findings the ureter was observed on the way out and noted to be normal. Foley catheter positioned draining clear urine the vaginal mucosa closed with 2-0 Vicryl interrupted sutures. Repeat laparoscopy carried out at that point with nasi irrigation revealing the operative site to be hemostatic even at reduced pressure. This was removed, gas allowed to escape  defect closed with 4-0 subcuticular suture and  Dermabond on the lower incision she tolerated this well went to recovery in good condition.  Dictated with [employer], not proofread Traylen Eckels M. Milana Obey.D.

## 2010-09-07 LAB — CBC
MCH: 30.7 pg (ref 26.0–34.0)
MCHC: 33.4 g/dL (ref 30.0–36.0)
Platelets: 156 10*3/uL (ref 150–400)
RBC: 3.26 MIL/uL — ABNORMAL LOW (ref 3.87–5.11)

## 2010-09-07 MED ORDER — IBUPROFEN 800 MG PO TABS: 800.0000 mg | ORAL_TABLET | Freq: Three times a day (TID) | ORAL | Status: DC | PRN

## 2010-09-07 MED ORDER — OXYCODONE-ACETAMINOPHEN 5-325 MG PO TABS: 1.0000 | ORAL_TABLET | ORAL | Status: DC | PRN

## 2010-09-07 NOTE — Progress Notes (Signed)
Pt was out of bed at bedside, moderate amount drainage noted at umbilical area serousangious drainage.  Placed pt to bed and pressure dressing applied.  Vital signs stable.

## 2010-09-07 NOTE — Progress Notes (Signed)
Scant amount drainage noted on pressure dressing, another pressure dressing applied to umbilical area, slight increased bruising noted at this time

## 2010-09-07 NOTE — Progress Notes (Signed)
POD #1 S/ no c/o, ready for D/c O/afeb, bruising @ inc., but dry  A/doing well, P/ D/ c today>>office 7-10 days

## 2010-09-07 NOTE — Discharge Summary (Signed)
Physician Discharge Summary  Patient ID: Meredith [name] MRN: [id number] DOB/AGE: 1965-07-24 44 y.o.  Admit date: 09/06/2010 Discharge date: 09/07/2010  Admission Diagnoses:1. Menorrhaghia 2.  SUI Discharge Diagnoses: same Active Problems:  * No active hospital problems. *    Discharged Condition: good  Hospital Course: LAVH + midurethral sling 8/17.  POD # 1, pt afeb., inc C/D, voiding satiisf.  Consults: none  Significant Diagnostic Studies: labs:    Treatments: surgery: LAVH/Solyx sling  Discharge Exam: Blood pressure 91/56, pulse 75, temperature 97.9 F (36.6 C), temperature source Oral, resp. rate 18, last menstrual period 08/30/2010, SpO2 96.00%. bruising at inc, but otherwise C/D  Disposition: Final discharge disposition not confirmed   Current Discharge Medication List    START taking these medications   Details  ibuprofen (ADVIL,MOTRIN) 800 MG tablet Take 1 tablet (800 mg total) by mouth every 8 (eight) hours as needed for pain (mild pain). Qty: 30 tablet, Refills: 2    oxyCODONE-acetaminophen (PERCOCET) 5-325 MG per tablet Take 1-2 tablets by mouth every 3 (three) hours as needed (moderate to severe pain (when tolerating fluids)). Qty: 30 tablet, Refills: 0      CONTINUE these medications which have NOT CHANGED   Details  amphetamine-dextroamphetamine (ADDERALL XR, 30MG ,) 30 MG 24 hr capsule Take 30 mg by mouth every morning.         Follow-up Information    Follow up with [facility]. (office will call)    Contact information:   [address] [place] [address] [phone]          Signed: [name] 09/07/2010, 8:23 AM

## 2010-09-15 ENCOUNTER — Observation Stay (HOSPITAL_COMMUNITY)
Admission: AD | Admit: 2010-09-15 | Discharge: 2010-09-16 | Disposition: A | Discharge status: Home / Self Care | Payer: BC Managed Care – PPO | Source: Ambulatory Visit | Attending: Obstetrics and Gynecology | Admitting: Obstetrics and Gynecology

## 2010-09-15 ENCOUNTER — Encounter (HOSPITAL_COMMUNITY): Payer: Self-pay | Admitting: *Deleted

## 2010-09-15 DIAGNOSIS — Y838 Other surgical procedures as the cause of abnormal reaction of the patient, or of later complication, without mention of misadventure at the time of the procedure: Secondary | ICD-10-CM | POA: Insufficient documentation

## 2010-09-15 DIAGNOSIS — R509 Fever, unspecified: Secondary | ICD-10-CM

## 2010-09-15 DIAGNOSIS — T8140XA Infection following a procedure, unspecified, initial encounter: Principal | ICD-10-CM | POA: Insufficient documentation

## 2010-09-15 DIAGNOSIS — N760 Acute vaginitis: Secondary | ICD-10-CM | POA: Insufficient documentation

## 2010-09-15 HISTORY — DX: Other specified behavioral and emotional disorders with onset usually occurring in childhood and adolescence: F98.8

## 2010-09-15 LAB — CBC
HCT: 35.2 % — ABNORMAL LOW (ref 36.0–46.0)
Hemoglobin: 11.8 g/dL — ABNORMAL LOW (ref 12.0–15.0)
MCH: 30 pg (ref 26.0–34.0)
MCHC: 33.5 g/dL (ref 30.0–36.0)
MCV: 89.6 fL (ref 78.0–100.0)

## 2010-09-15 LAB — URINALYSIS, ROUTINE W REFLEX MICROSCOPIC
Ketones, ur: NEGATIVE mg/dL
Nitrite: NEGATIVE
Protein, ur: NEGATIVE mg/dL
pH: 7 (ref 5.0–8.0)

## 2010-09-15 LAB — DIFFERENTIAL
Basophils Relative: 0 % (ref 0–1)
Eosinophils Absolute: 0 10*3/uL (ref 0.0–0.7)
Monocytes Absolute: 0.6 10*3/uL (ref 0.1–1.0)
Monocytes Relative: 8 % (ref 3–12)

## 2010-09-15 MED ORDER — DEXAMETHASONE SODIUM PHOSPHATE 10 MG/ML IJ SOLN
10.0000 mg | Freq: Once | INTRAMUSCULAR | Status: DC
  Filled 2010-09-15: qty 1

## 2010-09-15 MED ORDER — DEXTROSE 5 % IV SOLN
1.0000 g | Freq: Once | INTRAVENOUS | Status: AC
  Administered 2010-09-15: 1 g via INTRAVENOUS
  Filled 2010-09-15: qty 1

## 2010-09-15 MED ORDER — HYDROMORPHONE HCL 1 MG/ML IJ SOLN
2.0000 mg | Freq: Once | INTRAMUSCULAR | Status: AC
  Administered 2010-09-15: 2 mg via INTRAVENOUS
  Filled 2010-09-15: qty 2

## 2010-09-15 MED ORDER — DEXAMETHASONE SODIUM PHOSPHATE 10 MG/ML IJ SOLN
10.0000 mg | Freq: Once | INTRAMUSCULAR | Status: AC
  Administered 2010-09-15: 10 mg via INTRAVENOUS
  Filled 2010-09-15: qty 1

## 2010-09-15 MED ORDER — OXYCODONE-ACETAMINOPHEN 5-325 MG PO TABS: 1.0000 | ORAL_TABLET | ORAL | Status: DC | PRN

## 2010-09-15 MED ORDER — ACETAMINOPHEN 325 MG PO TABS
650.0000 mg | ORAL_TABLET | Freq: Once | ORAL | Status: AC
  Administered 2010-09-15: 650 mg via ORAL
  Filled 2010-09-15: qty 2

## 2010-09-15 MED ORDER — DEXTROSE 5 % IN LACTATED RINGERS IV BOLUS
1000.0000 mL | Freq: Once | INTRAVENOUS | Status: AC
  Administered 2010-09-15: 1000 mL via INTRAVENOUS

## 2010-09-15 MED ORDER — METOCLOPRAMIDE HCL 5 MG/ML IJ SOLN
10.0000 mg | Freq: Once | INTRAMUSCULAR | Status: AC
  Administered 2010-09-15: 10 mg via INTRAVENOUS
  Filled 2010-09-15: qty 2

## 2010-09-15 MED ORDER — DEXTROSE 5 % IN LACTATED RINGERS IV BOLUS
1000.0000 mL | Freq: Once | INTRAVENOUS | Status: AC
  Administered 2010-09-16: 1000 mL via INTRAVENOUS

## 2010-09-15 MED ORDER — ZOLPIDEM TARTRATE 10 MG PO TABS: 10.0000 mg | ORAL_TABLET | Freq: Every evening | ORAL | Status: DC | PRN

## 2010-09-15 MED ORDER — DIPHENHYDRAMINE HCL 50 MG/ML IJ SOLN
25.0000 mg | Freq: Once | INTRAMUSCULAR | Status: AC
  Administered 2010-09-15: 25 mg via INTRAVENOUS
  Filled 2010-09-15: qty 1

## 2010-09-15 MED ORDER — SODIUM CHLORIDE 0.9 % IV SOLN
8.0000 mg | Freq: Three times a day (TID) | INTRAVENOUS | Status: DC | PRN
  Administered 2010-09-16: 8 mg via INTRAVENOUS
  Filled 2010-09-15: qty 4

## 2010-09-15 MED ORDER — ONDANSETRON HCL 4 MG/2ML IJ SOLN: 4.0000 mg | Freq: Once | INTRAMUSCULAR | Status: DC

## 2010-09-15 MED ORDER — SODIUM CHLORIDE 0.9 % IV SOLN
Freq: Once | INTRAVENOUS | Status: AC
  Administered 2010-09-15: 20:00:00 via INTRAVENOUS

## 2010-09-15 MED ORDER — SODIUM CHLORIDE 0.9 % IV SOLN
Freq: Once | INTRAVENOUS | Status: AC
  Administered 2010-09-15: 18:00:00 via INTRAVENOUS

## 2010-09-15 NOTE — ED Provider Notes (Signed)
History     Chief Complaint  Patient presents with  . Headache  . Nausea   HPI Meredith [name] 45 y.o. S/P hysterectomy on 09-06-10.  Comes for evaluation today with fever and headache.  Has been feeling bad since Thursday afternoon.  Yesterday had fever.  Called the office today.  Did not take any medication today.  Is having nausea.  Yesterday had vomiting.  Had children at football today for 4 hours and then took a nap but was not feeling any better.  Was seen at Urgent Care on Wed for constipation and hemorrhoids.    OB History    Grav Para Term Preterm Abortions TAB SAB Ect Mult Living   3 3 1 2     1 3       Past Medical History  Diagnosis Date  . ADD (attention deficit disorder with hyperactivity)     maintained on adderall  . Attention deficit disorder of adult     Past Surgical History  Procedure Date  . Cesarean section   . Laparoscopy 2011    diagnostic with lysis of adhesions  . Laparoscopy x2 for in prep for in vitro  . Abdominal hysterectomy     Family History  Problem Relation Age of Onset  . Adopted: Yes    History  Substance Use Topics  . Smoking status: Former Smoker -- 15 years    Quit date: 09/02/2008  . Smokeless tobacco: Not on file  . Alcohol Use: No    Allergies: No Known Allergies  Prescriptions prior to admission  Medication Sig Dispense Refill  . amphetamine-dextroamphetamine (ADDERALL XR, 30MG ,) 30 MG 24 hr capsule Take 30 mg by mouth every morning.        Marland Kitchen ibuprofen (ADVIL,MOTRIN) 800 MG tablet Take 1 tablet (800 mg total) by mouth every 8 (eight) hours as needed for pain (mild pain).  30 tablet  2  . oxyCODONE-acetaminophen (PERCOCET) 5-325 MG per tablet Take 1-2 tablets by mouth every 3 (three) hours as needed (moderate to severe pain (when tolerating fluids)).  30 tablet  0    Review of Systems  Constitutional: Positive for fever and chills.  Gastrointestinal: Positive for nausea and vomiting.  Genitourinary:       Having some  vaginal bleeding  Neurological: Positive for headaches.   Physical Exam   Blood pressure 130/90, pulse 109, temperature 99.7 F (37.6 C), temperature source Oral, height 5\' 8"  (1.727 m), weight 161 lb 3.2 oz (73.12 kg), last menstrual period 08/27/2010.  Physical Exam  Nursing note and vitals reviewed. Constitutional: She is oriented to person, place, and time. She appears well-developed and well-nourished. She appears distressed.       Lying in fetal position in darkened room.  Able to be in supine position but does not open eyes due to severe headache.  HENT:  Head: Normocephalic.  Neck: Neck supple.  GI: Soft. Bowel sounds are normal. There is no tenderness. There is no rebound and no guarding.       Bruising noted on abdomen from umbilicus to above pubic bone.  Musculoskeletal: Normal range of motion.  Neurological: She is alert and oriented to person, place, and time.  Skin: Skin is warm and dry.  Psychiatric:       tearful    MAU Course  Procedures Results for orders placed during the hospital encounter of 09/15/10 (from the past 24 hour(s))  URINALYSIS, ROUTINE W REFLEX MICROSCOPIC     Status: Abnormal  Collection Time   09/15/10  5:15 PM      Component Value Range   Color, Urine YELLOW  YELLOW    Appearance HAZY (*) CLEAR    Specific Gravity, Urine 1.020  1.005 - 1.030    pH 7.0  5.0 - 8.0    Glucose, UA NEGATIVE  NEGATIVE (mg/dL)   Hgb urine dipstick LARGE (*) NEGATIVE    Bilirubin Urine NEGATIVE  NEGATIVE    Ketones, ur NEGATIVE  NEGATIVE (mg/dL)   Protein, ur NEGATIVE  NEGATIVE (mg/dL)   Urobilinogen, UA 0.2  0.0 - 1.0 (mg/dL)   Nitrite NEGATIVE  NEGATIVE    Leukocytes, UA LARGE (*) NEGATIVE   URINE MICROSCOPIC-ADD ON     Status: Abnormal   Collection Time   09/15/10  5:15 PM      Component Value Range   Squamous Epithelial / LPF FEW (*) RARE    WBC, UA 11-20  <3 (WBC/hpf)   RBC / HPF 0-2  <3 (RBC/hpf)   Bacteria, UA FEW (*) RARE   CBC     Status: Abnormal    Collection Time   09/15/10  6:00 PM      Component Value Range   WBC 8.0  4.0 - 10.5 (K/uL)   RBC 3.93  3.87 - 5.11 (MIL/uL)   Hemoglobin 11.8 (*) 12.0 - 15.0 (g/dL)   HCT 04.5 (*) 40.9 - 46.0 (%)   MCV 89.6  78.0 - 100.0 (fL)   MCH 30.0  26.0 - 34.0 (pg)   MCHC 33.5  30.0 - 36.0 (g/dL)   RDW 81.1  91.4 - 78.2 (%)   Platelets 203  150 - 400 (K/uL)  DIFFERENTIAL     Status: Abnormal   Collection Time   09/15/10  6:00 PM      Component Value Range   Neutrophils Relative 83 (*) 43 - 77 (%)   Neutro Abs 6.6  1.7 - 7.7 (K/uL)   Lymphocytes Relative 9 (*) 12 - 46 (%)   Lymphs Abs 0.7  0.7 - 4.0 (K/uL)   Monocytes Relative 8  3 - 12 (%)   Monocytes Absolute 0.6  0.1 - 1.0 (K/uL)   Eosinophils Relative 0  0 - 5 (%)   Eosinophils Absolute 0.0  0.0 - 0.7 (K/uL)   Basophils Relative 0  0 - 1 (%)   Basophils Absolute 0.0  0.0 - 0.1 (K/uL)    MDM IVF with decadron, benadryl and reglan IV for headache given.  Headache not resolved. Reviewed plan of care with Dr. Arelia Sneddon.  Will give Dilaudid 2 mg IV for headache and Rocephin 1 gm IV for UTI and fever. Urine culture pending.  Assessment and Plan    BURLESON,TERRI 09/15/2010, 5:23 PM

## 2010-09-15 NOTE — Progress Notes (Signed)
Onset of headache two days ago with nausea, denies history of migraines, patient had hysterectomy last Thursday

## 2010-09-15 NOTE — H&P (Signed)
Subjective:   Patient is a 45 y.o. female presents with fever and sore throat as well as constipation and hemorrhoid pain. Onset of symptoms was abrupt starting 2 days ago with unchanged course since that time. The pain is located in rectum and throat.  States throat is only sore at night. Patient describes the pain as intermittent. Pain has been associated with constipation. There are no active problems to display for this patient.  Past Medical History  Diagnosis Date  . ADD (attention deficit disorder with hyperactivity)     maintained on adderall  . Attention deficit disorder of adult     Past Surgical History  Procedure Date  . Cesarean section   . Laparoscopy 2011    diagnostic with lysis of adhesions  . Laparoscopy x2 for in prep for in vitro  . Abdominal hysterectomy     Prescriptions prior to admission  Medication Sig Dispense Refill  . amphetamine-dextroamphetamine (ADDERALL XR, 30MG ,) 30 MG 24 hr capsule Take 30 mg by mouth every morning.        . hydrocortisone-pramoxine (PROCTOFOAM-HC) rectal foam Place 1 applicator rectally 2 (two) times daily.        Marland Kitchen ibuprofen (ADVIL,MOTRIN) 800 MG tablet Take 1 tablet (800 mg total) by mouth every 8 (eight) hours as needed for pain (mild pain).  30 tablet  2  . OVER THE COUNTER MEDICATION Take 1 each by mouth daily. One herbal tea bag throughout the day for constipation       . oxyCODONE-acetaminophen (PERCOCET) 5-325 MG per tablet Take 1-2 tablets by mouth every 3 (three) hours as needed (moderate to severe pain (when tolerating fluids)).  30 tablet  0  . AMOXICILLIN PO Take 1 capsule by mouth.         No Known Allergies  History  Substance Use Topics  . Smoking status: Former Smoker -- 15 years    Quit date: 09/02/2008  . Smokeless tobacco: Not on file  . Alcohol Use: No    Family History  Problem Relation Age of Onset  . Adopted: Yes    Review of Systems Pertinent items are noted in HPI.  Objective:   Patient Vitals  for the past 8 hrs:  BP Temp Temp src Pulse Resp SpO2 Height Weight  09/15/10 1923 109/73 mmHg 99.5 F (37.5 C) Oral 90  16  95 % - -  09/15/10 1857 - 100.4 F (38 C) Oral - - - - -  09/15/10 1712 130/90 mmHg 99.7 F (37.6 C) Oral 109  - - 5\' 8"  (1.727 m) 161 lb 3.2 oz (73.12 kg)          See previous exams.     Assessment:   Active Problems:  * No active hospital problems. *    Plan:   Per Dr Arelia Sneddon, admit for 23 hr observation and he will see in am

## 2010-09-15 NOTE — ED Provider Notes (Signed)
History     Chief Complaint  Patient presents with  . Headache  . Nausea   The history is provided by the patient and the spouse.    See above. Care assumed from T. Burleson NP.     ROS Physical Exam   Blood pressure 109/73, pulse 90, temperature 99.5 F (37.5 C), temperature source Oral, resp. rate 16, height 5\' 8"  (1.727 m), weight 161 lb 3.2 oz (73.12 kg), last menstrual period 08/27/2010, SpO2 95.00%.  Physical Exam  MAU Course  Procedures    Assessment and Plan  Pt states she is feeling better. No nausea and headache is better.  Reported to Dr Arelia Sneddon.  Just prior to decision to d/c home, husband arrives and is concerned that patient is sicker than she admits. He states she has had 102 fevers and sore throat for 2 days. Also had "surgery on her hemorrhoids" Wed. After Urgent Care visit for constipation. Wants to have all this investigated prior to patient going home. I did a throat culture and examined her hemorrhoids.  There is a single thrombosed hemorrhoid visible which is not bleeding and is firm. No sign of infection. Abdomen is soft and nontender.  Per Dr Arelia Sneddon, will admit overnight and he will see her tomorrow.   [facility] 09/15/2010, 9:39 PM

## 2010-09-15 NOTE — Progress Notes (Signed)
Patient also reports fever since Thursday, today 102.8

## 2010-09-16 MED ORDER — DIPHENHYDRAMINE HCL 50 MG/ML IJ SOLN: 25.0000 mg | Freq: Once | INTRAMUSCULAR | Status: DC

## 2010-09-16 MED ORDER — OXYCODONE-ACETAMINOPHEN 5-325 MG PO TABS: 1.0000 | ORAL_TABLET | ORAL | Status: AC | PRN

## 2010-09-16 MED ORDER — HYDROMORPHONE HCL 1 MG/ML IJ SOLN: 2.0000 mg | Freq: Once | INTRAMUSCULAR | Status: DC

## 2010-09-16 MED ORDER — AMOXICILLIN-POT CLAVULANATE 500-125 MG PO TABS: 1.0000 | ORAL_TABLET | Freq: Three times a day (TID) | ORAL | Status: AC

## 2010-09-16 NOTE — Discharge Summary (Signed)
Meredith [name], Meredith [name]                ACCOUNT NO.:  [id number]  MEDICAL RECORD NO.:  [id number]  LOCATION:  9319                          FACILITY:  [facility]  PHYSICIAN:  Juluis Mire, M.D.   DATE OF BIRTH:  [birth year]  DATE OF ADMISSION:  09/15/2010 DATE OF DISCHARGE:  09/16/2010                              DISCHARGE SUMMARY   ADMITTING DIAGNOSIS:  Postoperative laparoscopically assisted vaginal hysterectomy mid urethral sling with fever, possible cuff cellulitis.  POSTOPERATIVE DIAGNOSIS:  Postoperative laparoscopically assisted vaginal hysterectomy mid urethral sling with fever, possible cuff cellulitis.  OPERATIVE PROCEDURE:  None.  FOR COMPLETE HISTORY AND PHYSICAL:  Please see written note.  COURSE IN THE HOSPITAL:  The patient was brought in and begun on hydration and Rocephin.  Her white count was minimally elevated.  The next morning, she was afebrile, stable vital signs.  Abdomen was soft and really nontender.  She had a large ecchymosis from previous LAVH. Her bowel sounds were active.  She had a bowel movement.  She was tolerating her diet, feeling much better, and we decided to discharge her home at that time.  In terms of complication, none encountered during her stay in the hospital.  The patient charged home in stable condition.  DISPOSITION:  We will discharge her home on Augmentin to take for the next 5 days.  She is going to follow up in the office on Tuesday.  She should call with increasing signs of infection with fever, nausea, vomiting, excessive pain, or active vaginal bleeding.     Juluis Mire, M.D.     JSM/MEDQ  D:  09/16/2010  T:  09/16/2010  Job:  [id number]

## 2010-09-16 NOTE — Progress Notes (Signed)
   Subjective: Patient reports tolerating PO. Feels much better.  Did have BM   Objective: I have reviewed patient's vital signs.  Afebrile VSS  General: alert ABD: soft post bs Incisions clear no active bleeding Assessment: s/p : post-op fever resolved possible cuff cellulitis  Plan: Discharge home  LOS: 1 day    Phill Steck S 09/16/2010, 9:17 AM

## 2010-09-16 NOTE — Discharge Summary (Signed)
  Patient name  Meredith [name] DICTATION# [id number] CSN# [id number]  Juluis Mire, MD 09/16/2010 9:23 AM

## 2010-09-17 LAB — URINE CULTURE

## 2010-09-25 NOTE — Progress Notes (Signed)
UR Chart review completed.  

## 2010-10-01 ENCOUNTER — Ambulatory Visit (INDEPENDENT_AMBULATORY_CARE_PROVIDER_SITE_OTHER): Payer: BC Managed Care – PPO | Admitting: General Surgery

## 2010-10-01 ENCOUNTER — Encounter (HOSPITAL_COMMUNITY): Payer: Self-pay | Admitting: Obstetrics and Gynecology

## 2010-10-01 VITALS — BP 118/78 | HR 80 | Temp 97.7°F | Resp 18 | Ht 68.0 in | Wt 157.0 lb

## 2010-10-01 DIAGNOSIS — K648 Other hemorrhoids: Secondary | ICD-10-CM

## 2010-10-01 DIAGNOSIS — K645 Perianal venous thrombosis: Secondary | ICD-10-CM

## 2010-10-01 NOTE — Progress Notes (Signed)
Chief Complaint  Patient presents with  . Rectal Problems    hems and fissure    HPI Meredith [name] is a 45 y.o. female.    This pleasant woman was referred to me by Dr. Richarda Overlie for evaluation of hemorrhoids, rectal pain, and rectal bleeding.  The patient states that she has had intermittent problems with hemorrhoids, off and on, for years. She recently had a laparoscopic assisted vaginal hysterectomy, and that surgery very well. She became constipated and did not have a bowel movement for 4 or 5 days. She then apparently developed thrombosed hemorrhoids and was seen at [facility] and underwent incision and drainage. The next day she saw Dr. Henderson Cloud and went underwent another incision and drainage of a thrombosed hemorrhoid. She says that the external mass effect is less but she still has bleeding and is still tender. She is changing a pad once or twice a day. She has not seen any clots. The constipation is better. She has never had a colonoscopy. HPI  Past Medical History  Diagnosis Date  . ADD (attention deficit disorder with hyperactivity)     maintained on adderall  . Attention deficit disorder of adult     Past Surgical History  Procedure Date  . Cesarean section   . Laparoscopy 2011    diagnostic with lysis of adhesions  . Laparoscopy x2 for in prep for in vitro  . Abdominal hysterectomy   . Laparoscopic assisted vaginal hysterectomy 09/06/2010    Procedure: LAPAROSCOPIC ASSISTED VAGINAL HYSTERECTOMY;  Surgeon: [name];  Location: [facility];  Service: Gynecology;  Laterality: Bilateral;  abdomen/vagina  . Bladder suspension 09/06/2010    Procedure: TRANSVAGINAL TAPE (TVT) PROCEDURE;  Surgeon: [name];  Location: [facility];  Service: Gynecology;  Laterality: Bilateral;  solyx    Family History  Problem Relation Age of Onset  . Adopted: Yes    Social History History  Substance Use Topics  . Smoking status: Former Smoker -- 15 years    Quit date:  09/02/2008  . Smokeless tobacco: Not on file  . Alcohol Use: No    No Known Allergies  Current Outpatient Prescriptions  Medication Sig Dispense Refill  . amphetamine-dextroamphetamine (ADDERALL) 30 MG tablet Take 30 mg by mouth daily.          Review of Systems Review of Systems  Constitutional: Negative.   HENT: Negative.   Eyes: Negative.   Respiratory: Negative.   Cardiovascular: Negative.   Gastrointestinal: Positive for constipation, anal bleeding and rectal pain. Negative for nausea, vomiting, abdominal pain, diarrhea and abdominal distention.  Genitourinary: Negative.   Musculoskeletal: Negative.   Skin: Negative.   Neurological: Negative.   Hematological: Negative.   Psychiatric/Behavioral: Negative.        ADD.    Blood pressure 118/78, pulse 80, temperature 97.7 F (36.5 C), temperature source Temporal, resp. rate 18, height 5\' 8"  (1.727 m), weight 157 lb (71.215 kg), last menstrual period 08/27/2010.  Physical Exam Physical Exam  Constitutional: She is oriented to person, place, and time. She appears well-developed and well-nourished. No distress.  HENT:  Head: Normocephalic and atraumatic.  Mouth/Throat: No oropharyngeal exudate.  Eyes: Conjunctivae are normal. Pupils are equal, round, and reactive to light. Left eye exhibits no discharge. No scleral icterus.  Neck: Normal range of motion. Neck supple. No JVD present. No tracheal deviation present. No thyromegaly present.  Cardiovascular: Normal rate, normal heart sounds and intact distal pulses.   No murmur heard. Pulmonary/Chest: Effort  normal and breath sounds normal. No respiratory distress. She has no wheezes. She has no rales. She exhibits no tenderness.  Abdominal: Soft. Bowel sounds are normal. She exhibits no distension and no mass. There is no tenderness. There is no rebound and no guarding.    Genitourinary:     Lymphadenopathy:    She has no cervical adenopathy.  Neurological: She is alert  and oriented to person, place, and time. She exhibits normal muscle tone.  Skin: Skin is warm and dry. No rash noted. No erythema. No pallor.  Psychiatric: She has a normal mood and affect. Her behavior is normal. Judgment and thought content normal.    Data Reviewed I reviewed the office notes from the recent incision and drainage of thrombosed hemorrhoids. Assessment    Thrombosed external hemorrhoid, right posterior and left posterior. Status post recent incision and drainage elsewhere. These wounds are open but still appeared to be healing uneventfully.  Internal hemorrhoids, right anterior, right posterior, and left lateral, injected with sclerosing solution today. These may be contributing to her bleeding.    Plan    Hydration.  Metamucil capsules twice a day.  Sitz baths as needed for external discomfort  Anticipate complete healing in 3-4 weeks.  Return to see me p.r.n.       Aaliayah Miao M 10/01/2010, 2:00 PM

## 2010-10-01 NOTE — Patient Instructions (Signed)
You have two open wounds, right posterior and left posterior where the other physicians cut into the thrombosed hemorrhoid and removed the clot. Those wounds are still open but they appear to be healing well and should completely heal in 2-3 weeks. They may be part of the source of bleeding. You also had internal hemorrhoids which were injected with sclerosing solution today. Any bleeding that was coming from the internal hemorrhoids  should stop within 2-3 days. Take Metamucil capsules twice a day and drinks 5-6 glasses of water or juice a day. Please read the patient information booklet that I gave you. Followup with me in the future if necessary if further problems arise.

## 2010-10-19 LAB — CBC
HCT: 37.4
Hemoglobin: 12.5
MCHC: 33.5
RDW: 13.6

## 2010-10-19 LAB — POCT I-STAT, CHEM 8
Calcium, Ion: 1.2
HCT: 38
Hemoglobin: 12.9
TCO2: 25

## 2010-10-19 LAB — DIFFERENTIAL
Basophils Absolute: 0
Basophils Relative: 0
Eosinophils Relative: 2
Monocytes Absolute: 0.6

## 2011-03-12 ENCOUNTER — Telehealth: Payer: Self-pay | Admitting: *Deleted

## 2011-05-14 NOTE — Telephone Encounter (Signed)
Done

## 2011-06-03 ENCOUNTER — Other Ambulatory Visit: Payer: Self-pay | Admitting: Obstetrics and Gynecology

## 2011-06-03 DIAGNOSIS — Z1231 Encounter for screening mammogram for malignant neoplasm of breast: Secondary | ICD-10-CM

## 2011-06-04 ENCOUNTER — Ambulatory Visit
Admission: RE | Admit: 2011-06-04 | Discharge: 2011-06-04 | Disposition: A | Discharge status: Home / Self Care | Payer: BC Managed Care – PPO | Source: Ambulatory Visit | Attending: Obstetrics and Gynecology | Admitting: Obstetrics and Gynecology

## 2011-06-04 DIAGNOSIS — Z1231 Encounter for screening mammogram for malignant neoplasm of breast: Secondary | ICD-10-CM

## 2012-01-03 IMAGING — US US TRANSVAGINAL NON-OB
1 series · 13 of 25 positions shown · non-contrast
Comparison: CT of the abdomen and pelvis performed 08/09/2007

CLINICAL DATA: Mid to lower abdominal pain for 2-3 days; history of
C-section.



[Series 1: us transvaginal non-ob · 0.24mm/px · 13 of 130 slices shown]
[im 1/130]
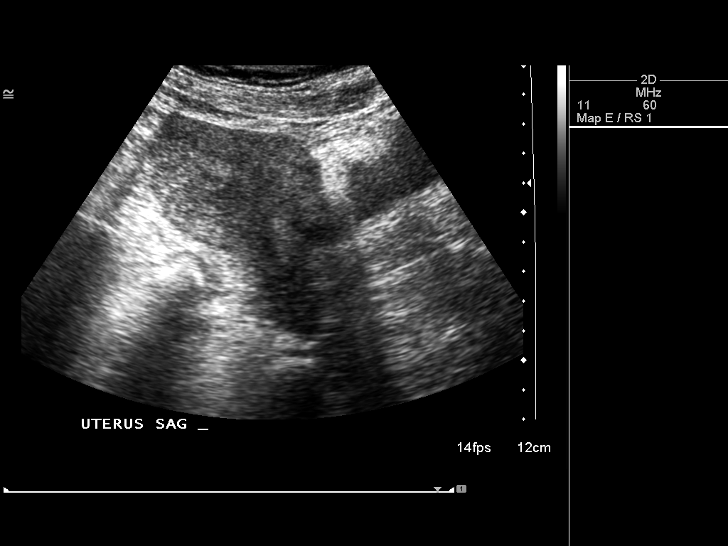
[im 11/130]
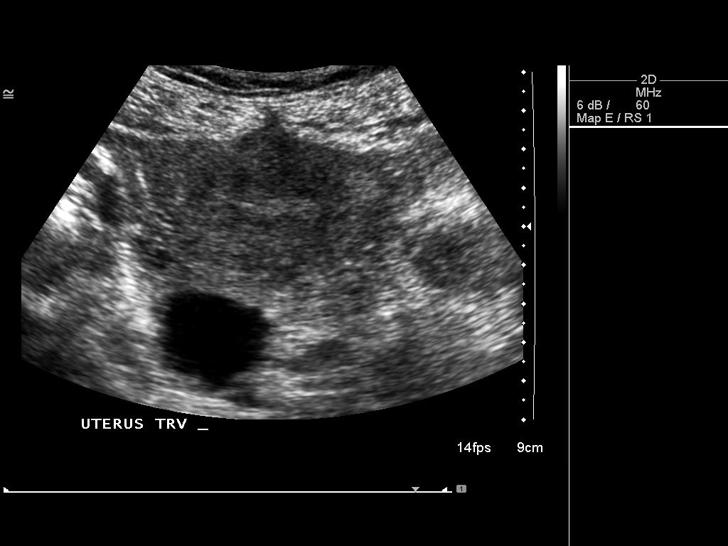
[im 22/130]
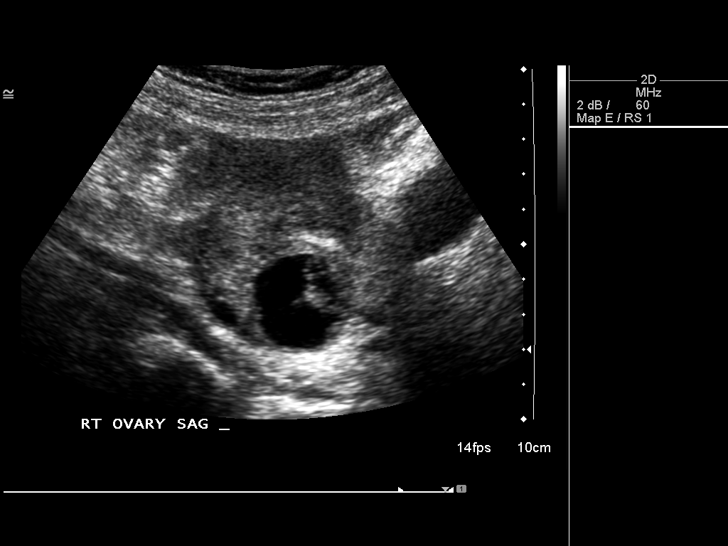
[im 33/130]
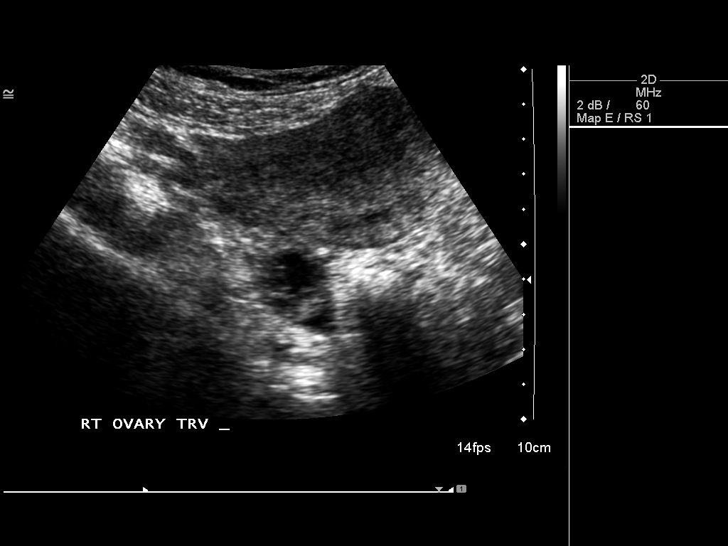
[im 44/130]
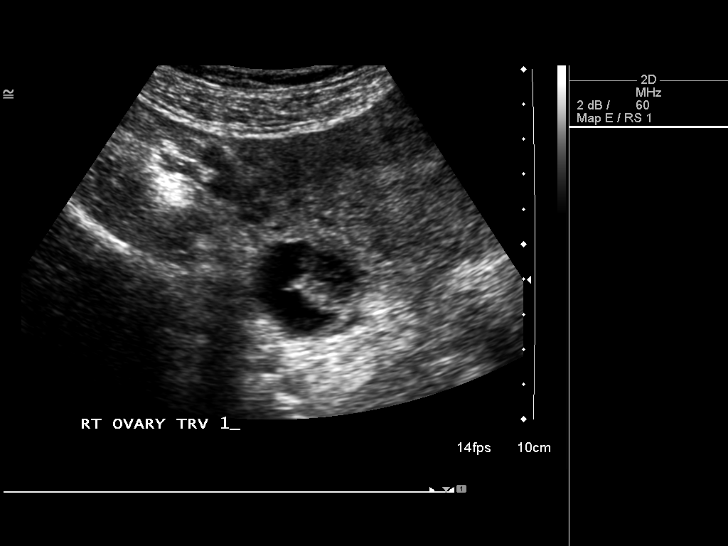
[im 54/130]
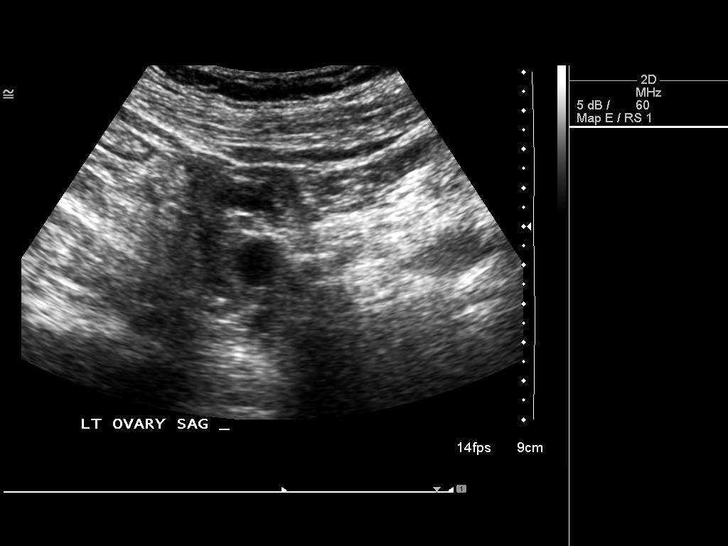
[im 65/130]
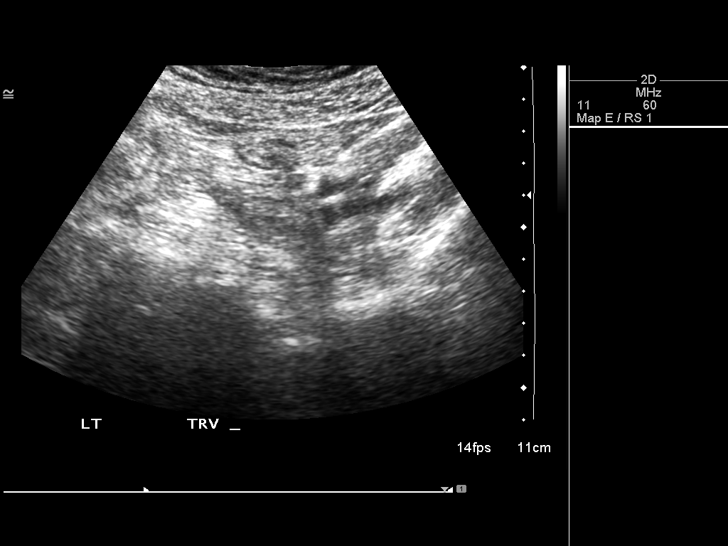
[im 76/130]
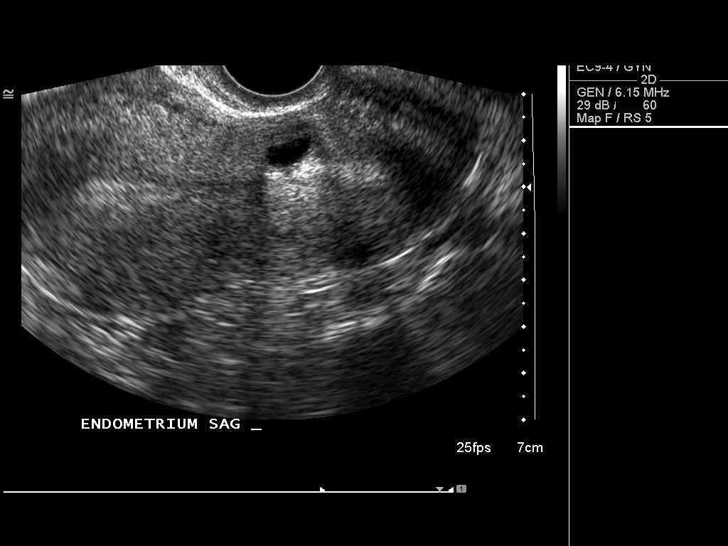
[im 87/130]
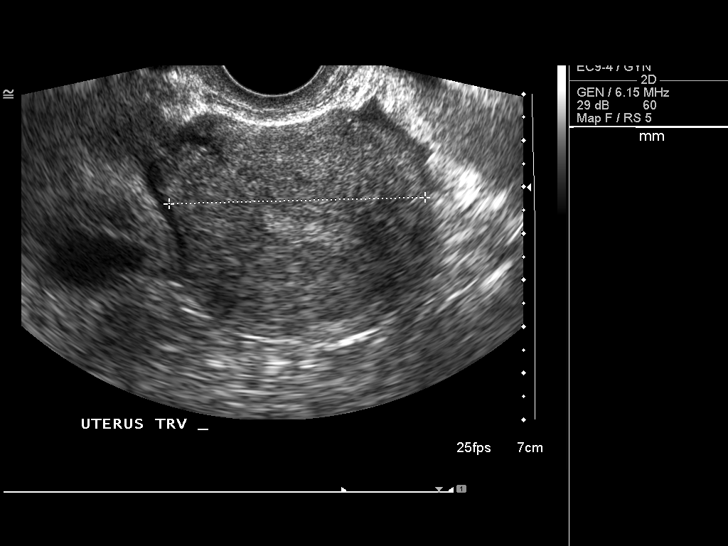
[im 97/130]
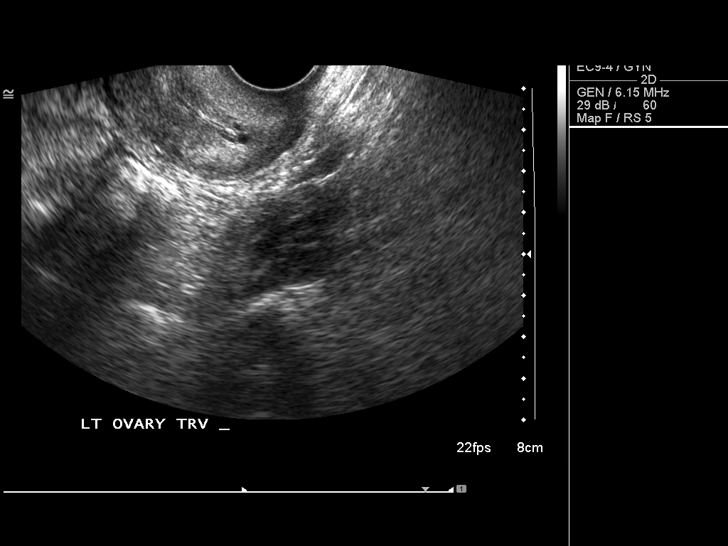
[im 108/130]
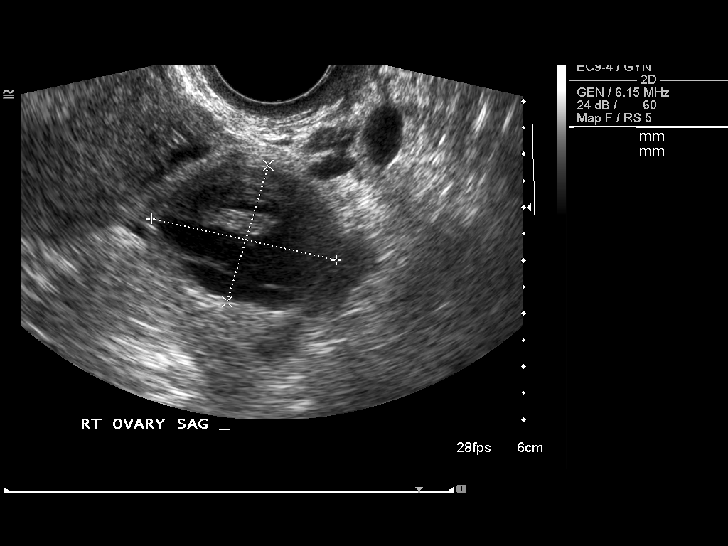
[im 119/130]
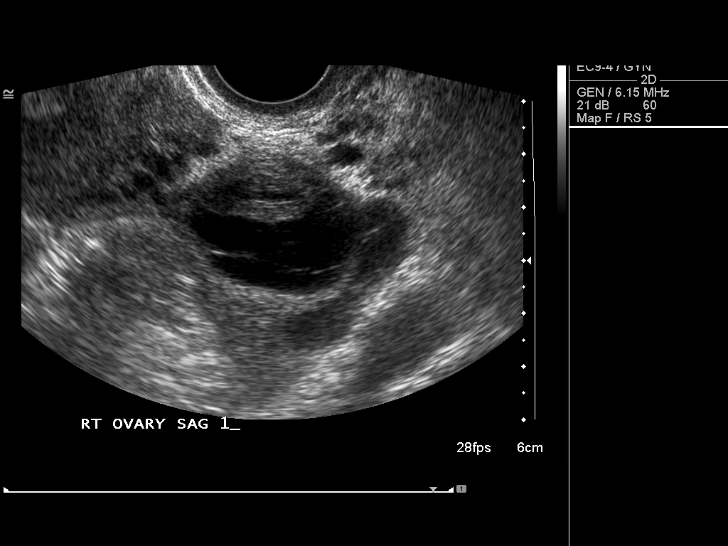
[im 130/130]
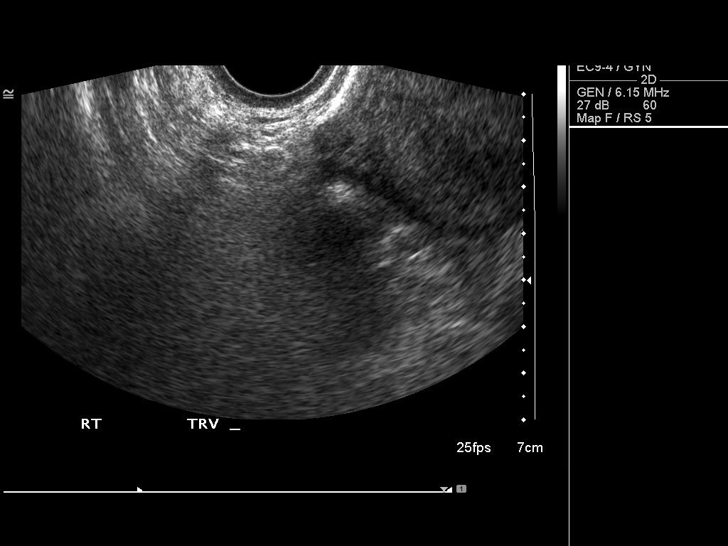

[13 of 25 positions shown; findings below may reference images not displayed]

FINDINGS: The patient was previously told she has a T-shaped uterus.  On
correlation with the prior CT, although the cornu are relatively
widely separated, the lack of indentation at the fundus means that
this remains within normal limits.  No definite arcuate or
bicornuate uterus is seen.

The uterus measures 8.3 cm in length, 5.6 cm in AP dimension and
6.3 cm in transverse dimension.  The endometrial echo complex
measures 0.7 cm in thickness.  Myometrial echogenicity is within
normal limits.  No significant fibroids are identified.  A
prominent Nabothian cyst is noted at the cervix.

The right ovary is larger than the left.  A cystic lesion with a
single prominent septation is noted arising at the right ovary,
measuring 3.1 x 1.4 x 2.8 cm. This remains within physiologic
limits; the patient's pain is not specifically localized to this
area.

The right ovary measures 5.9 x 3.3 x 4.1 cm, while the left ovary
measures 3.4 x 2.0 x 2.5 cm.  Limited Doppler evaluation
demonstrates grossly normal color Doppler blood flow with respect
to both ovaries.  There is no evidence of ovarian torsion. No
suspicious adnexal masses are seen.

No free fluid is seen within the pelvic cul-de-sac.
IMPRESSION: Unremarkable pelvic ultrasound.

## 2012-12-24 ENCOUNTER — Ambulatory Visit (INDEPENDENT_AMBULATORY_CARE_PROVIDER_SITE_OTHER): Payer: 59 | Admitting: Nurse Practitioner

## 2012-12-24 ENCOUNTER — Encounter: Payer: Self-pay | Admitting: Nurse Practitioner

## 2012-12-24 VITALS — BP 110/74 | HR 74 | Temp 98.3°F | Ht 67.0 in | Wt 175.0 lb

## 2012-12-24 DIAGNOSIS — Z0000 Encounter for general adult medical examination without abnormal findings: Secondary | ICD-10-CM

## 2012-12-24 DIAGNOSIS — Z8659 Personal history of other mental and behavioral disorders: Secondary | ICD-10-CM | POA: Insufficient documentation

## 2012-12-24 DIAGNOSIS — Z85828 Personal history of other malignant neoplasm of skin: Secondary | ICD-10-CM | POA: Insufficient documentation

## 2012-12-24 DIAGNOSIS — Z8719 Personal history of other diseases of the digestive system: Secondary | ICD-10-CM | POA: Insufficient documentation

## 2012-12-24 NOTE — Progress Notes (Signed)
Pre-visit discussion using our clinic review tool. No additional management support is needed unless otherwise documented below in the visit note.  

## 2012-12-24 NOTE — Progress Notes (Signed)
Subjective:     Meredith [name] is a 47 y.o. female and is here for a comprehensive physical exam. She reports a history of ADD, depression, anal fissures, constipation, hemorrhoids, and basal cell carcinoma. Currently, she is feeling stressed regarding having gone back to school, working full-time, and personal issues. She feels if she gets back on ADD meds regularly, she will feel more focused and able to handle stressors. Her PCP with [facility] recently left the practice. She wishes to transfer care here.  History   Social History  . Marital Status: Married    Spouse Name: N/A    Number of Children: 3  . Years of Education: N/A   Occupational History  .       sales, travels   Social History Main Topics  . Smoking status: Former Smoker -- 15 years    Quit date: 09/02/2008  . Smokeless tobacco: Not on file  . Alcohol Use: No  . Drug Use: No  . Sexual Activity: Yes   Other Topics Concern  . Not on file   Social History Narrative   Lives w/husband & 3 children. Works 60 hrs/wk, travels. 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   Health Maintenance  Topic Date Due  . Pap Smear  11/16/1982  . Tetanus/tdap  11/16/1983  . Influenza Vaccine  01/24/2013    The following portions of the patient's history were reviewed and updated as appropriate: allergies, current medications, past family history, past medical history, past social history, past surgical history and problem list.  Review of Systems Constitutional: negative for chills, fevers and having "hot flashes", weight gain due to not exercising Eyes: positive for uses reading glasses Ears, nose, mouth, throat, and face: negative Respiratory: negative Cardiovascular: positive for lower extremity edema, negative for chest pain, chest pressure/discomfort and irregular heart beat Gastrointestinal: positive for constipation and hemorrhoids, anal fissures Genitourinary:positive for partial hysterectomy Integument/breast:  positive for Hx of basal cell carcinoma Neurological: positive for migraine HA about 1 every 3 mo. goes to bed for relief. Behavioral/Psych: Hx of depression, ADD since childhood Allergic/Immunologic: positive for hay fever   Objective:    BP 110/74  Pulse 74  Temp(Src) 98.3 F (36.8 C) (Oral)  Ht 5\' 7"  (1.702 m)  Wt 175 lb (79.379 kg)  BMI 27.40 kg/m2  SpO2 97%  LMP 08/27/2010 General appearance: alert, cooperative, appears stated age, mild distress and somewhat tearful when discussing stressors. Head: Normocephalic, without obvious abnormality, atraumatic Eyes: negative findings: lids and lashes normal, conjunctivae and sclerae normal, corneas clear and pupils equal, round, reactive to light and accomodation Ears: normal TM's and external ear canals both ears Throat: lips, mucosa, and tongue normal; teeth and gums normal Back: symmetric, no curvature. ROM normal. No CVA tenderness. Lungs: clear to auscultation bilaterally Heart: regular rate and rhythm, S1, S2 normal, no murmur, click, rub or gallop Abdomen: soft, non-tender; bowel sounds normal; no masses,  no organomegaly Extremities: extremities normal, atraumatic, no cyanosis or edema Lymph nodes: Cervical, supraclavicular, and axillary nodes normal. Neurologic: Alert and oriented X 3, normal strength and tone. Normal symmetric reflexes. Normal coordination and gait    Assessment:    Healthy female exam Anxiety/depression-expresses frustration over several stressors in life. Historically treated for depression-did not feel meds were helpful. ADD meds help focus when stressed.     Plan:    Labs as ordered. Will f/u as necessary Request records from historical provider. Will review & consider prescribing adderall, or refer to psych. See  After Visit Summary for Counseling Recommendations

## 2012-12-24 NOTE — Patient Instructions (Addendum)
Our office will call you with lab results and any needed follow up. I will review historical records and be in touch. Pleasure to meet you!  Preventive Care for Adults, Female A healthy lifestyle and preventive care can promote health and wellness. Preventive health guidelines for women include the following key practices.  A routine yearly physical is a good way to check with your caregiver about your health and preventive screening. It is a chance to share any concerns and updates on your health, and to receive a thorough exam.  Visit your dentist for a routine exam and preventive care every 6 months. Brush your teeth twice a day and floss once a day. Good oral hygiene prevents tooth decay and gum disease.  The frequency of eye exams is based on your age, health, family medical history, use of contact lenses, and other factors. Follow your caregiver's recommendations for frequency of eye exams.  Eat a healthy diet. Foods like vegetables, fruits, whole grains, low-fat dairy products, and lean protein foods contain the nutrients you need without too many calories. Decrease your intake of foods high in solid fats, added sugars, and salt. Eat the right amount of calories for you.Get information about a proper diet from your caregiver, if necessary.  Regular physical exercise is one of the most important things you can do for your health. Most adults should get at least 150 minutes of moderate-intensity exercise (any activity that increases your heart rate and causes you to sweat) each week. In addition, most adults need muscle-strengthening exercises on 2 or more days a week.  Maintain a healthy weight. The body mass index (BMI) is a screening tool to identify possible weight problems. It provides an estimate of body fat based on height and weight. Your caregiver can help determine your BMI, and can help you achieve or maintain a healthy weight.For adults 20 years and older:  A BMI below 18.5 is  considered underweight.  A BMI of 18.5 to 24.9 is normal.  A BMI of 25 to 29.9 is considered overweight.  A BMI of 30 and above is considered obese.  Maintain normal blood lipids and cholesterol levels by exercising and minimizing your intake of saturated fat. Eat a balanced diet with plenty of fruit and vegetables. Blood tests for lipids and cholesterol should begin at age 20 and be repeated every 5 years. If your lipid or cholesterol levels are high, you are over 50, or you are at high risk for heart disease, you may need your cholesterol levels checked more frequently.Ongoing high lipid and cholesterol levels should be treated with medicines if diet and exercise are not effective.  If you smoke, find out from your caregiver how to quit. If you do not use tobacco, do not start.  Lung cancer screening is recommended for adults aged 31 80 years who are at high risk for developing lung cancer because of a history of smoking. Yearly low-dose computed tomography (CT) is recommended for people who have at least a 30-pack-year history of smoking and are a current smoker or have quit within the past 15 years. A pack year of smoking is smoking an average of 1 pack of cigarettes a day for 1 year (for example: 1 pack a day for 30 years or 2 packs a day for 15 years). Yearly screening should continue until the smoker has stopped smoking for at least 15 years. Yearly screening should also be stopped for people who develop a health problem that would prevent them  from having lung cancer treatment.  If you are pregnant, do not drink alcohol. If you are breastfeeding, be very cautious about drinking alcohol. If you are not pregnant and choose to drink alcohol, do not exceed 1 drink per day. One drink is considered to be 12 ounces (355 mL) of beer, 5 ounces (148 mL) of wine, or 1.5 ounces (44 mL) of liquor.  Avoid use of street drugs. Do not share needles with anyone. Ask for help if you need support or  instructions about stopping the use of drugs.  High blood pressure causes heart disease and increases the risk of stroke. Your blood pressure should be checked at least every 1 to 2 years. Ongoing high blood pressure should be treated with medicines if weight loss and exercise are not effective.  If you are 59 to 48 years old, ask your caregiver if you should take aspirin to prevent strokes.  Diabetes screening involves taking a blood sample to check your fasting blood sugar level. This should be done once every 3 years, after age 72, if you are within normal weight and without risk factors for diabetes. Testing should be considered at a younger age or be carried out more frequently if you are overweight and have at least 1 risk factor for diabetes.  Breast cancer screening is essential preventive care for women. You should practice "breast self-awareness." This means understanding the normal appearance and feel of your breasts and may include breast self-examination. Any changes detected, no matter how small, should be reported to a caregiver. Women in their 53s and 30s should have a clinical breast exam (CBE) by a caregiver as part of a regular health exam every 1 to 3 years. After age 58, women should have a CBE every year. Starting at age 71, women should consider having a mammography (breast X-ray test) every year. Women who have a family history of breast cancer should talk to their caregiver about genetic screening. Women at a high risk of breast cancer should talk to their caregivers about having magnetic resonance imaging (MRI) and a mammography every year.  Breast cancer gene (BRCA)-related cancer risk assessment is recommended for women who have family members with BRCA-related cancers. BRCA-related cancers include breast, ovarian, tubal, and peritoneal cancers. Having family members with these cancers may be associated with an increased risk for harmful changes (mutations) in the breast cancer  genes BRCA1 and BRCA2. Results of the assessment will determine the need for genetic counseling and BRCA1 and BRCA2 testing.  The Pap test is a screening test for cervical cancer. A Pap test can show cell changes on the cervix that might become cervical cancer if left untreated. A Pap test is a procedure in which cells are obtained and examined from the lower end of the uterus (cervix).  Women should have a Pap test starting at age 74.  Between ages 73 and 2, Pap tests should be repeated every 2 years.  Beginning at age 76, you should have a Pap test every 3 years as long as the past 3 Pap tests have been normal.  Some women have medical problems that increase the chance of getting cervical cancer. Talk to your caregiver about these problems. It is especially important to talk to your caregiver if a new problem develops soon after your last Pap test. In these cases, your caregiver may recommend more frequent screening and Pap tests.  The above recommendations are the same for women who have or have not gotten the  vaccine for human papillomavirus (HPV).  If you had a hysterectomy for a problem that was not cancer or a condition that could lead to cancer, then you no longer need Pap tests. Even if you no longer need a Pap test, a regular exam is a good idea to make sure no other problems are starting.  If you are between ages 4 and 24, and you have had normal Pap tests going back 10 years, you no longer need Pap tests. Even if you no longer need a Pap test, a regular exam is a good idea to make sure no other problems are starting.  If you have had past treatment for cervical cancer or a condition that could lead to cancer, you need Pap tests and screening for cancer for at least 20 years after your treatment.  If Pap tests have been discontinued, risk factors (such as a new sexual partner) need to be reassessed to determine if screening should be resumed.  The HPV test is an additional test  that may be used for cervical cancer screening. The HPV test looks for the virus that can cause the cell changes on the cervix. The cells collected during the Pap test can be tested for HPV. The HPV test could be used to screen women aged 44 years and older, and should be used in women of any age who have unclear Pap test results. After the age of 73, women should have HPV testing at the same frequency as a Pap test.  Colorectal cancer can be detected and often prevented. Most routine colorectal cancer screening begins at the age of 22 and continues through age 2. However, your caregiver may recommend screening at an earlier age if you have risk factors for colon cancer. On a yearly basis, your caregiver may provide home test kits to check for hidden blood in the stool. Use of a small camera at the end of a tube, to directly examine the colon (sigmoidoscopy or colonoscopy), can detect the earliest forms of colorectal cancer. Talk to your caregiver about this at age 76, when routine screening begins. Direct examination of the colon should be repeated every 5 to 10 years through age 71, unless early forms of pre-cancerous polyps or small growths are found.  Hepatitis C blood testing is recommended for all people born from [birth year] through 1965 and any individual with known risks for hepatitis C.  Practice safe sex. Use condoms and avoid high-risk sexual practices to reduce the spread of sexually transmitted infections (STIs). STIs include gonorrhea, chlamydia, syphilis, trichomonas, herpes, HPV, and human immunodeficiency virus (HIV). Herpes, HIV, and HPV are viral illnesses that have no cure. They can result in disability, cancer, and death. Sexually active women aged 25 and younger should be checked for chlamydia. Older women with new or multiple partners should also be tested for chlamydia. Testing for other STIs is recommended if you are sexually active and at increased risk.  Osteoporosis is a disease in  which the bones lose minerals and strength with aging. This can result in serious bone fractures. The risk of osteoporosis can be identified using a bone density scan. Women ages 2 and over and women at risk for fractures or osteoporosis should discuss screening with their caregivers. Ask your caregiver whether you should take a calcium supplement or vitamin D to reduce the rate of osteoporosis.  Menopause can be associated with physical symptoms and risks. Hormone replacement therapy is available to decrease symptoms and risks. You should talk  to your caregiver about whether hormone replacement therapy is right for you.  Use sunscreen. Apply sunscreen liberally and repeatedly throughout the day. You should seek shade when your shadow is shorter than you. Protect yourself by wearing long sleeves, pants, a wide-brimmed hat, and sunglasses year round, whenever you are outdoors.  Once a month, do a whole body skin exam, using a mirror to look at the skin on your back. Notify your caregiver of new moles, moles that have irregular borders, moles that are larger than a pencil eraser, or moles that have changed in shape or color.  Stay current with required immunizations.  Influenza vaccine. All adults should be immunized every year.  Tetanus, diphtheria, and acellular pertussis (Td, Tdap) vaccine. Pregnant women should receive 1 dose of Tdap vaccine during each pregnancy. The dose should be obtained regardless of the length of time since the last dose. Immunization is preferred during the 27th to 36th week of gestation. An adult who has not previously received Tdap or who does not know her vaccine status should receive 1 dose of Tdap. This initial dose should be followed by tetanus and diphtheria toxoids (Td) booster doses every 10 years. Adults with an unknown or incomplete history of completing a 3-dose immunization series with Td-containing vaccines should begin or complete a primary immunization series  including a Tdap dose. Adults should receive a Td booster every 10 years.  Varicella vaccine. An adult without evidence of immunity to varicella should receive 2 doses or a second dose if she has previously received 1 dose. Pregnant females who do not have evidence of immunity should receive the first dose after pregnancy. This first dose should be obtained before leaving the health care facility. The second dose should be obtained 4 8 weeks after the first dose.  Human papillomavirus (HPV) vaccine. Females aged 61 26 years who have not received the vaccine previously should obtain the 3-dose series. The vaccine is not recommended for use in pregnant females. However, pregnancy testing is not needed before receiving a dose. If a female is found to be pregnant after receiving a dose, no treatment is needed. In that case, the remaining doses should be delayed until after the pregnancy. Immunization is recommended for any person with an immunocompromised condition through the age of 76 years if she did not get any or all doses earlier. During the 3-dose series, the second dose should be obtained 4 8 weeks after the first dose. The third dose should be obtained 24 weeks after the first dose and 16 weeks after the second dose.  Zoster vaccine. One dose is recommended for adults aged 30 years or older unless certain conditions are present.  Measles, mumps, and rubella (MMR) vaccine. Adults born before 42 generally are considered immune to measles and mumps. Adults born in [birth year] or later should have 1 or more doses of MMR vaccine unless there is a contraindication to the vaccine or there is laboratory evidence of immunity to each of the three diseases. A routine second dose of MMR vaccine should be obtained at least 28 days after the first dose for students attending postsecondary schools, health care workers, or international travelers. People who received inactivated measles vaccine or an unknown type of measles  vaccine during 1963 1967 should receive 2 doses of MMR vaccine. People who received inactivated mumps vaccine or an unknown type of mumps vaccine before 1979 and are at high risk for mumps infection should consider immunization with 2 doses of MMR vaccine.  For females of childbearing age, rubella immunity should be determined. If there is no evidence of immunity, females who are not pregnant should be vaccinated. If there is no evidence of immunity, females who are pregnant should delay immunization until after pregnancy. Unvaccinated health care workers born before 60 who lack laboratory evidence of measles, mumps, or rubella immunity or laboratory confirmation of disease should consider measles and mumps immunization with 2 doses of MMR vaccine or rubella immunization with 1 dose of MMR vaccine.  Pneumococcal 13-valent conjugate (PCV13) vaccine. When indicated, a person who is uncertain of her immunization history and has no record of immunization should receive the PCV13 vaccine. An adult aged 40 years or older who has certain medical conditions and has not been previously immunized should receive 1 dose of PCV13 vaccine. This PCV13 should be followed with a dose of pneumococcal polysaccharide (PPSV23) vaccine. The PPSV23 vaccine dose should be obtained at least 8 weeks after the dose of PCV13 vaccine. An adult aged 38 years or older who has certain medical conditions and previously received 1 or more doses of PPSV23 vaccine should receive 1 dose of PCV13. The PCV13 vaccine dose should be obtained 1 or more years after the last PPSV23 vaccine dose.  Pneumococcal polysaccharide (PPSV23) vaccine. When PCV13 is also indicated, PCV13 should be obtained first. All adults aged 54 years and older should be immunized. An adult younger than age 49 years who has certain medical conditions should be immunized. Any person who resides in a nursing home or long-term care facility should be immunized. An adult smoker  should be immunized. People with an immunocompromised condition and certain other conditions should receive both PCV13 and PPSV23 vaccines. People with human immunodeficiency virus (HIV) infection should be immunized as soon as possible after diagnosis. Immunization during chemotherapy or radiation therapy should be avoided. Routine use of PPSV23 vaccine is not recommended for American Indians, [place] Natives, or people younger than 65 years unless there are medical conditions that require PPSV23 vaccine. When indicated, people who have unknown immunization and have no record of immunization should receive PPSV23 vaccine. One-time revaccination 5 years after the first dose of PPSV23 is recommended for people aged 41 64 years who have chronic kidney failure, nephrotic syndrome, asplenia, or immunocompromised conditions. People who received 1 2 doses of PPSV23 before age 44 years should receive another dose of PPSV23 vaccine at age 54 years or later if at least 5 years have passed since the previous dose. Doses of PPSV23 are not needed for people immunized with PPSV23 at or after age 48 years.  Meningococcal vaccine. Adults with asplenia or persistent complement component deficiencies should receive 2 doses of quadrivalent meningococcal conjugate (MenACWY-D) vaccine. The doses should be obtained at least 2 months apart. Microbiologists working with certain meningococcal bacteria, [employer], people at risk during an outbreak, and people who travel to or live in countries with a high rate of meningitis should be immunized. A first-year college student up through age 110 years who is living in a residence hall should receive a dose if she did not receive a dose on or after her 16th birthday. Adults who have certain high-risk conditions should receive one or more doses of vaccine.  Hepatitis A vaccine. Adults who wish to be protected from this disease, have certain high-risk conditions, work with hepatitis  A-infected animals, work in hepatitis A research labs, or travel to or work in countries with a high rate of hepatitis A should be immunized. Adults  who were previously unvaccinated and who anticipate close contact with an international adoptee during the first 60 days after arrival in the [place] from a country with a high rate of hepatitis A should be immunized.  Hepatitis B vaccine. Adults who wish to be protected from this disease, have certain high-risk conditions, may be exposed to blood or other infectious body fluids, are household contacts or sex partners of hepatitis B positive people, are clients or workers in certain care facilities, or travel to or work in countries with a high rate of hepatitis B should be immunized.  Haemophilus influenzae type b (Hib) vaccine. A previously unvaccinated person with asplenia or sickle cell disease or having a scheduled splenectomy should receive 1 dose of Hib vaccine. Regardless of previous immunization, a recipient of a hematopoietic stem cell transplant should receive a 3-dose series 6 12 months after her successful transplant. Hib vaccine is not recommended for adults with HIV infection. Preventive Services / Frequency Ages 75 to 33  Blood pressure check.** / Every 1 to 2 years.  Lipid and cholesterol check.** / Every 5 years beginning at age 31.  Clinical breast exam.** / Every 3 years for women in their 78s and 4s.  BRCA-related cancer risk assessment.** / For women who have family members with a BRCA-related cancer (breast, ovarian, tubal, or peritoneal cancers).  Pap test.** / Every 2 years from ages 66 through 66. Every 3 years starting at age 3 through age 70 or 42 with a history of 3 consecutive normal Pap tests.  HPV screening.** / Every 3 years from ages 70 through ages 78 to 70 with a history of 3 consecutive normal Pap tests.  Hepatitis C blood test.** / For any individual with known risks for hepatitis C.  Skin self-exam. /  Monthly.  Influenza vaccine. / Every year.  Tetanus, diphtheria, and acellular pertussis (Tdap, Td) vaccine.** / Consult your caregiver. Pregnant women should receive 1 dose of Tdap vaccine during each pregnancy. 1 dose of Td every 10 years.  Varicella vaccine.** / Consult your caregiver. Pregnant females who do not have evidence of immunity should receive the first dose after pregnancy.  HPV vaccine. / 3 doses over 6 months, if 2 and younger. The vaccine is not recommended for use in pregnant females. However, pregnancy testing is not needed before receiving a dose.  Measles, mumps, rubella (MMR) vaccine.** / You need at least 1 dose of MMR if you were born in 1957 or later. You may also need a 2nd dose. For females of childbearing age, rubella immunity should be determined. If there is no evidence of immunity, females who are not pregnant should be vaccinated. If there is no evidence of immunity, females who are pregnant should delay immunization until after pregnancy.  Pneumococcal 13-valent conjugate (PCV13) vaccine.** / Consult your caregiver.  Pneumococcal polysaccharide (PPSV23) vaccine.** / 1 to 2 doses if you smoke cigarettes or if you have certain conditions.  Meningococcal vaccine.** / 1 dose if you are age 57 to 69 years and a Market researcher living in a residence hall, or have one of several medical conditions, you need to get vaccinated against meningococcal disease. You may also need additional booster doses.  Hepatitis A vaccine.** / Consult your caregiver.  Hepatitis B vaccine.** / Consult your caregiver.  Haemophilus influenzae type b (Hib) vaccine.** / Consult your caregiver. Ages 60 to 70  Blood pressure check.** / Every 1 to 2 years.  Lipid and cholesterol check.** / Every 5  years beginning at age 65.  Lung cancer screening. / Every year if you are aged 31 80 years and have a 30-pack-year history of smoking and currently smoke or have quit within the past  15 years. Yearly screening is stopped once you have quit smoking for at least 15 years or develop a health problem that would prevent you from having lung cancer treatment.  Clinical breast exam.** / Every year after age 99.  BRCA-related cancer risk assessment.** / For women who have family members with a BRCA-related cancer (breast, ovarian, tubal, or peritoneal cancers).  Mammogram.** / Every year beginning at age 92 and continuing for as long as you are in good health. Consult with your caregiver.  Pap test.** / Every 3 years starting at age 65 through age 67 or 30 with a history of 3 consecutive normal Pap tests.  HPV screening.** / Every 3 years from ages 70 through ages 46 to 72 with a history of 3 consecutive normal Pap tests.  Fecal occult blood test (FOBT) of stool. / Every year beginning at age 67 and continuing until age 38. You may not need to do this test if you get a colonoscopy every 10 years.  Flexible sigmoidoscopy or colonoscopy.** / Every 5 years for a flexible sigmoidoscopy or every 10 years for a colonoscopy beginning at age 86 and continuing until age 7.  Hepatitis C blood test.** / For all people born from [birth year] through 1966 and any individual with known risks for hepatitis C.  Skin self-exam. / Monthly.  Influenza vaccine. / Every year.  Tetanus, diphtheria, and acellular pertussis (Tdap/Td) vaccine.** / Consult your caregiver. Pregnant women should receive 1 dose of Tdap vaccine during each pregnancy. 1 dose of Td every 10 years.  Varicella vaccine.** / Consult your caregiver. Pregnant females who do not have evidence of immunity should receive the first dose after pregnancy.  Zoster vaccine.** / 1 dose for adults aged 85 years or older.  Measles, mumps, rubella (MMR) vaccine.** / You need at least 1 dose of MMR if you were born in 1957 or later. You may also need a 2nd dose. For females of childbearing age, rubella immunity should be determined. If there is no  evidence of immunity, females who are not pregnant should be vaccinated. If there is no evidence of immunity, females who are pregnant should delay immunization until after pregnancy.  Pneumococcal 13-valent conjugate (PCV13) vaccine.** / Consult your caregiver.  Pneumococcal polysaccharide (PPSV23) vaccine.** / 1 to 2 doses if you smoke cigarettes or if you have certain conditions.  Meningococcal vaccine.** / Consult your caregiver.  Hepatitis A vaccine.** / Consult your caregiver.  Hepatitis B vaccine.** / Consult your caregiver.  Haemophilus influenzae type b (Hib) vaccine.** / Consult your caregiver. Ages 65 and over  Blood pressure check.** / Every 1 to 2 years.  Lipid and cholesterol check.** / Every 5 years beginning at age 69.  Lung cancer screening. / Every year if you are aged 70 80 years and have a 30-pack-year history of smoking and currently smoke or have quit within the past 15 years. Yearly screening is stopped once you have quit smoking for at least 15 years or develop a health problem that would prevent you from having lung cancer treatment.  Clinical breast exam.** / Every year after age 29.  BRCA-related cancer risk assessment.** / For women who have family members with a BRCA-related cancer (breast, ovarian, tubal, or peritoneal cancers).  Mammogram.** / Every year beginning at  age 41 and continuing for as long as you are in good health. Consult with your caregiver.  Pap test.** / Every 3 years starting at age 49 through age 59 or 38 with a 3 consecutive normal Pap tests. Testing can be stopped between 65 and 70 with 3 consecutive normal Pap tests and no abnormal Pap or HPV tests in the past 10 years.  HPV screening.** / Every 3 years from ages 37 through ages 22 or 27 with a history of 3 consecutive normal Pap tests. Testing can be stopped between 65 and 70 with 3 consecutive normal Pap tests and no abnormal Pap or HPV tests in the past 10 years.  Fecal occult  blood test (FOBT) of stool. / Every year beginning at age 44 and continuing until age 60. You may not need to do this test if you get a colonoscopy every 10 years.  Flexible sigmoidoscopy or colonoscopy.** / Every 5 years for a flexible sigmoidoscopy or every 10 years for a colonoscopy beginning at age 39 and continuing until age 42.  Hepatitis C blood test.** / For all people born from [birth year] through 1966 and any individual with known risks for hepatitis C.  Osteoporosis screening.** / A one-time screening for women ages 84 and over and women at risk for fractures or osteoporosis.  Skin self-exam. / Monthly.  Influenza vaccine. / Every year.  Tetanus, diphtheria, and acellular pertussis (Tdap/Td) vaccine.** / 1 dose of Td every 10 years.  Varicella vaccine.** / Consult your caregiver.  Zoster vaccine.** / 1 dose for adults aged 50 years or older.  Pneumococcal 13-valent conjugate (PCV13) vaccine.** / Consult your caregiver.  Pneumococcal polysaccharide (PPSV23) vaccine.** / 1 dose for all adults aged 41 years and older.  Meningococcal vaccine.** / Consult your caregiver.  Hepatitis A vaccine.** / Consult your caregiver.  Hepatitis B vaccine.** / Consult your caregiver.  Haemophilus influenzae type b (Hib) vaccine.** / Consult your caregiver. ** Family history and personal history of risk and conditions may change your caregiver's recommendations. Document Released: 03/05/2001 Document Revised: 05/04/2012 Document Reviewed: 06/04/2010 [facility] Patient Information 2014 [place], [place].

## 2013-02-12 ENCOUNTER — Encounter: Payer: Self-pay | Admitting: Nurse Practitioner

## 2013-02-12 DIAGNOSIS — Z8669 Personal history of other diseases of the nervous system and sense organs: Secondary | ICD-10-CM | POA: Insufficient documentation

## 2013-06-03 ENCOUNTER — Other Ambulatory Visit: Payer: Self-pay | Admitting: Dermatology

## 2013-11-22 ENCOUNTER — Encounter: Payer: Self-pay | Admitting: Nurse Practitioner

## 2015-05-20 ENCOUNTER — Encounter (HOSPITAL_BASED_OUTPATIENT_CLINIC_OR_DEPARTMENT_OTHER): Payer: Self-pay | Admitting: *Deleted

## 2015-05-20 ENCOUNTER — Emergency Department (HOSPITAL_BASED_OUTPATIENT_CLINIC_OR_DEPARTMENT_OTHER)
Admission: EM | Admit: 2015-05-20 | Discharge: 2015-05-20 | Disposition: A | Discharge status: Home / Self Care | Payer: 59 | Attending: Emergency Medicine | Admitting: Emergency Medicine

## 2015-05-20 DIAGNOSIS — S81811A Laceration without foreign body, right lower leg, initial encounter: Secondary | ICD-10-CM | POA: Diagnosis not present

## 2015-05-20 DIAGNOSIS — Y9389 Activity, other specified: Secondary | ICD-10-CM | POA: Insufficient documentation

## 2015-05-20 DIAGNOSIS — Z23 Encounter for immunization: Secondary | ICD-10-CM | POA: Diagnosis not present

## 2015-05-20 DIAGNOSIS — Y929 Unspecified place or not applicable: Secondary | ICD-10-CM | POA: Diagnosis not present

## 2015-05-20 DIAGNOSIS — Z87891 Personal history of nicotine dependence: Secondary | ICD-10-CM | POA: Diagnosis not present

## 2015-05-20 DIAGNOSIS — Y999 Unspecified external cause status: Secondary | ICD-10-CM | POA: Diagnosis not present

## 2015-05-20 DIAGNOSIS — IMO0002 Reserved for concepts with insufficient information to code with codable children: Secondary | ICD-10-CM

## 2015-05-20 DIAGNOSIS — W25XXXA Contact with sharp glass, initial encounter: Secondary | ICD-10-CM | POA: Insufficient documentation

## 2015-05-20 MED ORDER — BACITRACIN ZINC 500 UNIT/GM EX OINT: 1.0000 "application " | TOPICAL_OINTMENT | Freq: Two times a day (BID) | CUTANEOUS | Status: DC

## 2015-05-20 MED ORDER — LIDOCAINE-EPINEPHRINE 2 %-1:100000 IJ SOLN
INTRAMUSCULAR | Status: AC
  Filled 2015-05-20: qty 1

## 2015-05-20 MED ORDER — LIDOCAINE-EPINEPHRINE (PF) 2 %-1:200000 IJ SOLN: 10.0000 mL | Freq: Once | INTRAMUSCULAR | Status: DC

## 2015-05-20 MED ORDER — TETANUS-DIPHTH-ACELL PERTUSSIS 5-2.5-18.5 LF-MCG/0.5 IM SUSP
0.5000 mL | Freq: Once | INTRAMUSCULAR | Status: AC
  Administered 2015-05-20: 0.5 mL via INTRAMUSCULAR
  Filled 2015-05-20: qty 0.5

## 2015-05-20 NOTE — Discharge Instructions (Signed)
Keep her wound clean and dry. Follow-up with your doctor in the next 5-7 days for suture removal. Return to ED for new or worsening symptoms as we discussed.

## 2015-05-20 NOTE — ED Notes (Signed)
Laceration to front of right lower leg approx 5 cm. No bleeding noted. Reports she was cleaning out a rental and cut her leg on a broken TV

## 2015-05-20 NOTE — ED Provider Notes (Signed)
CSN: [id number]     Arrival date & time 05/20/15  1445 History  By signing my name below, I, Bethel Born, attest that this documentation has been prepared under the direction and in the presence of [employer]. Electronically Signed: Bethel Born, ED Scribe. 05/20/2015 4:13 PM   Chief Complaint  Patient presents with  . Extremity Laceration    The history is provided by the patient. No language interpreter was used.   Meredith [name] is a 50 y.o. female who presents to the Emergency Department complaining of a laceration at the right lower leg with onset just PTA. The pt was moving a broken television and a shard of glass cut her leg. She did not clean the wound PTA.  Reports throbbing pain, 4/10. Pt denies other injury, numbness, and tingling. Tetanus is out of date.    Past Medical History  Diagnosis Date  . ADD (attention deficit disorder with hyperactivity)     maintained on adderall  . Attention deficit disorder of adult   . Anal fissure   . Hemorrhoid thrombosis     surgery by Dr Derrell Lolling   Past Surgical History  Procedure Laterality Date  . Cesarean section    . Laparoscopy  2011    diagnostic with lysis of adhesions  . Laparoscopy  x2 for in prep for in vitro  . Abdominal hysterectomy    . Laparoscopic assisted vaginal hysterectomy  09/06/2010    Procedure: LAPAROSCOPIC ASSISTED VAGINAL HYSTERECTOMY;  Surgeon: [name];  Location: [facility];  Service: Gynecology;  Laterality: Bilateral;  abdomen/vagina  . Bladder suspension  09/06/2010    Procedure: TRANSVAGINAL TAPE (TVT) PROCEDURE;  Surgeon: [name];  Location: [facility];  Service: Gynecology;  Laterality: Bilateral;  solyx   Family History  Problem Relation Age of Onset  . Adopted: Yes  . Family history unknown: Yes   Social History  Substance Use Topics  . Smoking status: Former Smoker -- 15 years    Quit date: 09/02/2008  . Smokeless tobacco: None  . Alcohol Use: No   OB History    Gravida Para Term Preterm AB TAB SAB Ectopic Multiple Living   Review of Systems  10 Systems reviewed and all are negative for acute change except as noted in the HPI.  Allergies  Review of patient's allergies indicates no known allergies.  Home Medications   Prior to Admission medications   Medication Sig Start Date End Date Taking? Authorizing Provider  amphetamine-dextroamphetamine (ADDERALL) 30 MG tablet Take 30 mg by mouth daily.      Historical Provider, MD  amphetamine-dextroamphetamine (ADDERALL) 5 MG tablet Take 5 mg by mouth 2 (two) times daily as needed.    Historical Provider, MD   BP 106/85 mmHg  Pulse 88  Temp(Src) 97.8 F (36.6 C) (Oral)  Resp 18  Ht  (1.727 m)  Wt 165 lb (74.844 kg)  BMI 25.09 kg/m2  SpO2 98%  LMP 08/27/2010 Physical Exam  Constitutional: She is oriented to person, place, and time. She appears well-developed and well-nourished. No distress.  HENT:  Head: Normocephalic and atraumatic.  Eyes: Conjunctivae and EOM are normal.  Neck: Neck supple. No tracheal deviation present.  Cardiovascular: Normal rate, regular rhythm and normal heart sounds.   Pulmonary/Chest: Effort normal. No respiratory distress.  CTAB  Abdominal: Soft. There is no tenderness.  Musculoskeletal: Normal range of motion.  Able to  move all digits Sensation intact to light touch distal to wound Brisk capillary refill  Right shin distal tibia: approximately 4 cm oblique laceration with no bony or tendonous involvement  Neurological: She is alert and oriented to person, place, and time.  Skin: Skin is warm and dry.  Psychiatric: She has a normal mood and affect. Her behavior is normal.  Nursing note and vitals reviewed.   ED Course  Procedures (including critical care time) DIAGNOSTIC STUDIES: Oxygen Saturation is 98% on  RA,   normal by my interpretation.    COORDINATION OF CARE: 4:02 PM Discussed treatment plan which includes Tdap and  laceration repair with pt at bedside and pt agreed to plan.  Labs Review Labs Reviewed - No data to display  Imaging Review No results found.    EKG Interpretation None     LACERATION REPAIR Performed by: Sharlene Mottsartner, Avangeline Stockburger W Authorized by: Sharlene Mottsartner, Khamryn Calderone W Consent: Verbal consent obtained. Risks and benefits: risks, benefits and alternatives were discussed Consent given by: patient Patient identity confirmed: provided demographic data Prepped and Draped in normal sterile fashion Wound explored  Laceration Location: Right anterior tibia  Laceration Length: 4.5 cm  No Foreign Bodies seen or palpated  Anesthesia: local infiltration  Local anesthetic: lidocaine 2 % with epinephrine  Anesthetic total: 5 ml  Irrigation method: syringe Amount of cleaning: standard  Skin closure: 5-0 Ethilon   Number of sutures: Running suture   Technique: Running   Patient tolerance: Patient tolerated the procedure well with no immediate complications.  MDM  Tdap booster given.Pressure irrigation performed. Laceration occurred < 8 hours prior to repair which was well tolerated. Pt has no co morbidities to effect normal wound healing. Discussed suture home care w pt and answered questions. Pt to f-u for wound check and suture removal in 7 days. Pt is hemodynamically stable w no complaints prior to dc.    Final diagnoses:  Laceration    I personally performed the services described in this documentation, which was scribed in my presence. The recorded information has been reviewed and is accurate.    Joycie PeekBenjamin Jakai Onofre, PA-C 05/20/15 1710  Tilden FossaElizabeth Rees, MD 05/21/15 1500

## 2015-11-21 ENCOUNTER — Other Ambulatory Visit: Payer: Self-pay | Admitting: Obstetrics and Gynecology

## 2015-11-21 DIAGNOSIS — Z1231 Encounter for screening mammogram for malignant neoplasm of breast: Secondary | ICD-10-CM

## 2015-12-05 ENCOUNTER — Ambulatory Visit: Payer: 59

## 2015-12-08 ENCOUNTER — Ambulatory Visit
Admission: RE | Admit: 2015-12-08 | Discharge: 2015-12-08 | Disposition: A | Discharge status: Home / Self Care | Payer: 59 | Source: Ambulatory Visit | Attending: Obstetrics and Gynecology | Admitting: Obstetrics and Gynecology

## 2015-12-08 DIAGNOSIS — Z1231 Encounter for screening mammogram for malignant neoplasm of breast: Secondary | ICD-10-CM

## 2018-12-28 ENCOUNTER — Encounter (HOSPITAL_BASED_OUTPATIENT_CLINIC_OR_DEPARTMENT_OTHER): Payer: Self-pay

## 2018-12-28 ENCOUNTER — Other Ambulatory Visit: Payer: Self-pay

## 2018-12-28 ENCOUNTER — Emergency Department (HOSPITAL_BASED_OUTPATIENT_CLINIC_OR_DEPARTMENT_OTHER)
Admission: EM | Admit: 2018-12-28 | Discharge: 2018-12-28 | Disposition: A | Discharge status: Home / Self Care | Payer: BC Managed Care – PPO | Attending: Emergency Medicine | Admitting: Emergency Medicine

## 2018-12-28 DIAGNOSIS — M62830 Muscle spasm of back: Secondary | ICD-10-CM | POA: Diagnosis not present

## 2018-12-28 DIAGNOSIS — Z87891 Personal history of nicotine dependence: Secondary | ICD-10-CM | POA: Insufficient documentation

## 2018-12-28 DIAGNOSIS — Y9389 Activity, other specified: Secondary | ICD-10-CM | POA: Insufficient documentation

## 2018-12-28 DIAGNOSIS — S39012A Strain of muscle, fascia and tendon of lower back, initial encounter: Secondary | ICD-10-CM

## 2018-12-28 DIAGNOSIS — Z79899 Other long term (current) drug therapy: Secondary | ICD-10-CM | POA: Diagnosis not present

## 2018-12-28 DIAGNOSIS — Y998 Other external cause status: Secondary | ICD-10-CM | POA: Insufficient documentation

## 2018-12-28 DIAGNOSIS — Y9289 Other specified places as the place of occurrence of the external cause: Secondary | ICD-10-CM | POA: Insufficient documentation

## 2018-12-28 DIAGNOSIS — X509XXA Other and unspecified overexertion or strenuous movements or postures, initial encounter: Secondary | ICD-10-CM | POA: Insufficient documentation

## 2018-12-28 DIAGNOSIS — S3992XA Unspecified injury of lower back, initial encounter: Secondary | ICD-10-CM | POA: Diagnosis present

## 2018-12-28 MED ORDER — METHOCARBAMOL 500 MG PO TABS
500.0000 mg | ORAL_TABLET | Freq: Once | ORAL | Status: AC
  Administered 2018-12-28: 500 mg via ORAL
  Filled 2018-12-28: qty 1

## 2018-12-28 MED ORDER — METHYLPREDNISOLONE 4 MG PO TBPK: ORAL_TABLET | ORAL | 0 refills | Status: DC

## 2018-12-28 MED ORDER — PREDNISONE 10 MG PO TABS
60.0000 mg | ORAL_TABLET | Freq: Once | ORAL | Status: AC
  Administered 2018-12-28: 60 mg via ORAL
  Filled 2018-12-28: qty 1

## 2018-12-28 MED ORDER — METHOCARBAMOL 500 MG PO TABS: 500.0000 mg | ORAL_TABLET | Freq: Two times a day (BID) | ORAL | 0 refills | Status: DC

## 2018-12-28 MED ORDER — OXYCODONE-ACETAMINOPHEN 5-325 MG PO TABS
1.0000 | ORAL_TABLET | Freq: Once | ORAL | Status: AC
  Administered 2018-12-28: 1 via ORAL
  Filled 2018-12-28: qty 1

## 2018-12-28 NOTE — ED Provider Notes (Signed)
[facility] EMERGENCY DEPARTMENT Provider Note   CSN: [id number] Arrival date & time: 12/28/18  1112     History   Chief Complaint Chief Complaint  Patient presents with  . Back Pain    HPI Meredith [name] is a 53 y.o. female with a past medical history of migraines presenting to the ED with a chief complaint of back pain.  For the past 2 months been having aching lower back pain which has not been improved with over-the-counter medications, ice or chiropractor sessions.  She was told by her chiropractor today that she has "a problem with L5."  She denies any injuries or falls but is attributing the pain to frequent sitting at home after beginning working from home.  She has tried sitting in different positions, chairs and exercise ball with only minimal improvement.  Denies any loss of bowel or bladder function, dysuria, fever, prior back surgeries, history of cancer, history of IV drug use, shortness of breath.     HPI  Past Medical History:  Diagnosis Date  . ADD (attention deficit disorder with hyperactivity)    maintained on adderall  . Anal fissure   . Attention deficit disorder of adult   . Hemorrhoid thrombosis    surgery by Dr Derrell Lolling    Patient Active Problem List   Diagnosis Date Noted  . History of migraine 02/12/2013  . History of attention deficit disorder 12/24/2012  . History of depression 12/24/2012  . History of anal fissures 12/24/2012  . History of basal cell carcinoma 12/24/2012    Past Surgical History:  Procedure Laterality Date  . ABDOMINAL HYSTERECTOMY    . BLADDER SUSPENSION  09/06/2010   Procedure: TRANSVAGINAL TAPE (TVT) PROCEDURE;  Surgeon: [name];  Location: [facility];  Service: Gynecology;  Laterality: Bilateral;  solyx  . CESAREAN SECTION    . LAPAROSCOPIC ASSISTED VAGINAL HYSTERECTOMY  09/06/2010   Procedure: LAPAROSCOPIC ASSISTED VAGINAL HYSTERECTOMY;  Surgeon: [name];  Location: [facility];  Service: Gynecology;   Laterality: Bilateral;  abdomen/vagina  . LAPAROSCOPY  2011   diagnostic with lysis of adhesions  . LAPAROSCOPY  x2 for in prep for in vitro     OB History    Gravida  3   Para  3   Term  1   Preterm  2   AB      Living  3     SAB      TAB      Ectopic      Multiple  1   Live Births               Home Medications    Prior to Admission medications   Medication Sig Start Date End Date Taking? Authorizing Provider  amphetamine-dextroamphetamine (ADDERALL) 30 MG tablet Take 30 mg by mouth daily.      [provider]  amphetamine-dextroamphetamine (ADDERALL) 5 MG tablet Take 5 mg by mouth 2 (two) times daily as needed.    [provider]  methocarbamol (ROBAXIN) 500 MG tablet Take 1 tablet (500 mg total) by mouth 2 (two) times daily. 12/28/18   Cashton Hosley, PA-C  methylPREDNISolone (MEDROL DOSEPAK) 4 MG TBPK tablet Taper over 6 days. 12/28/18   Dietrich Pates, PA-C    Family History Family History  Adopted: Yes  Family history unknown: Yes    Social History Social History   Tobacco Use  . Smoking status: Former Smoker    Years: 15.00  Quit date: 09/02/2008    Years since quitting: 10.3  . Smokeless tobacco: Never Used  Substance Use Topics  . Alcohol use: No  . Drug use: No     Allergies   Patient has no known allergies.   Review of Systems Review of Systems  Constitutional: Negative for chills and fever.  Genitourinary: Negative for dysuria.  Musculoskeletal: Positive for back pain and myalgias.  Skin: Negative for wound.  Neurological: Negative for weakness and numbness.     Physical Exam Updated Vital Signs BP (!) 135/91 (BP Location: Right Arm)   Pulse 90   Temp 98.3 F (36.8 C) (Oral)   Resp 20   Ht 5\' 8"  (1.727 m)   Wt 85.7 kg   LMP 08/30/2010   SpO2 98%   BMI 28.74 kg/m   Physical Exam Vitals signs and nursing note reviewed.  Constitutional:      General: She is not in acute distress.    Appearance:  She is well-developed. She is not diaphoretic.  HENT:     Head: Normocephalic and atraumatic.  Eyes:     General: No scleral icterus.    Conjunctiva/sclera: Conjunctivae normal.  Neck:     Musculoskeletal: Normal range of motion.  Cardiovascular:     Rate and Rhythm: Normal rate and regular rhythm.     Heart sounds: Normal heart sounds.  Pulmonary:     Effort: Pulmonary effort is normal. No respiratory distress.     Breath sounds: Normal breath sounds.  Musculoskeletal:       Back:     Comments: No midline spinal tenderness present in lumbar, thoracic or cervical spine. No step-off palpated. No visible bruising, edema or temperature change noted. No objective signs of numbness present. No saddle anesthesia. 2+ DP pulses bilaterally. Sensation intact to light touch. Strength 5/5 in bilateral lower extremities.  Skin:    Findings: No rash.  Neurological:     Mental Status: She is alert.      ED Treatments / Results  Labs (all labs ordered are listed, but only abnormal results are displayed) Labs Reviewed - No data to display  EKG None  Radiology No results found.  Procedures Procedures (including critical care time)  Medications Ordered in ED Medications  predniSONE (DELTASONE) tablet 60 mg (has no administration in time range)  oxyCODONE-acetaminophen (PERCOCET/ROXICET) 5-325 MG per tablet 1 tablet (has no administration in time range)  methocarbamol (ROBAXIN) tablet 500 mg (has no administration in time range)     Initial Impression / Assessment and Plan / ED Course  I have reviewed the triage vital signs and the nursing notes.  Pertinent labs & imaging results that were available during my care of the patient were reviewed by me and considered in my medical decision making (see chart for details).        Patient denies any concerning symptoms suggestive of cauda equina requiring urgent imaging at this time such as loss of sensation in the lower extremities,  lower extremity weakness, loss of bowel or bladder control, saddle anesthesia, urinary retention, fever/chills, IVDU. Exam demonstrated no  weakness on exam today. No preceding injury or trauma to suggest acute fracture. Doubt pelvic or urinary pathology for patient's acute back pain, as patient denies urinary symptoms, has no vaginal discharge, and is not pregnant as she has prior hysterectomy. Doubt AAA as cause of patient's back pain as patient lacks major risk factors, had no abdominal TTP, and has symmetric and intact distal pulses. Patient given  strict return precautions for any symptoms indicating worsening neurologic function in the lower extremities.  Pain is reproducible to palpation on exam.  She remains ambulatory.  Unsure if her symptoms are worsening due to constant sitting and chiropractor sessions.  Nevertheless will prescribe steroid Dosepak and muscle relaxer.   Patient is hemodynamically stable, in NAD, and able to ambulate in the ED. Evaluation does not show pathology that would require ongoing emergent intervention or inpatient treatment. I explained the diagnosis to the patient. Pain has been managed and has no complaints prior to discharge. Patient is comfortable with above plan and is stable for discharge at this time. All questions were answered prior to disposition. Strict return precautions for returning to the ED were discussed. Encouraged follow up with PCP.   An After Visit Summary was printed and given to the patient.   Portions of this note were generated with [employer]. Dictation errors may occur despite best attempts at proofreading.    Final Clinical Impressions(s) / ED Diagnoses   Final diagnoses:  Strain of lumbar region, initial encounter    ED Discharge Orders         Ordered    methylPREDNISolone (MEDROL DOSEPAK) 4 MG TBPK tablet  Status:  Discontinued     12/28/18 1321    methocarbamol (ROBAXIN) 500 MG tablet  2 times daily     12/28/18  1321    methylPREDNISolone (MEDROL DOSEPAK) 4 MG TBPK tablet     12/28/18 1321           Delia Heady, PA-C 12/28/18 1326    Lucrezia Starch, MD 12/29/18 [phone]

## 2018-12-28 NOTE — Discharge Instructions (Signed)
Take the medications to help with your symptoms. Follow-up with the specialist listed below for further evaluation. Return to the ED if you start to experience worsening symptoms, injuries or falls, numbness in arms or legs, chest pain or shortness of breath.

## 2018-12-28 NOTE — ED Triage Notes (Addendum)
Pt c/o lower back pain x 2 months-denies injury-states she is seeing chiropractor with no relief-NAD-steady gait into triage-began crying while in triage

## 2019-04-23 ENCOUNTER — Ambulatory Visit: Payer: BC Managed Care – PPO | Attending: Internal Medicine

## 2019-04-23 DIAGNOSIS — Z23 Encounter for immunization: Secondary | ICD-10-CM

## 2019-04-23 NOTE — Progress Notes (Signed)
   Covid-19 Vaccination Clinic  Name:  Meredith [name]    MRN: [id number] DOB: 1965-04-14  04/23/2019  [name] was observed post Covid-19 immunization for 15 minutes without incident. She was provided with Vaccine Information Sheet and instruction to access the V-Safe system.   [name] was instructed to call 911 with any severe reactions post vaccine: Marland Kitchen Difficulty breathing  . Swelling of face and throat  . A fast heartbeat  . A bad rash all over body  . Dizziness and weakness   Immunizations Administered    Name Date Dose VIS Date Route   Pfizer COVID-19 Vaccine 04/23/2019  9:53 AM 0.3 mL 01/01/2019 Intramuscular   Manufacturer: [employer], [employer]   Lot: [id number]   NDC: [address]-9

## 2019-05-17 ENCOUNTER — Ambulatory Visit: Payer: BC Managed Care – PPO

## 2019-05-26 ENCOUNTER — Ambulatory Visit: Payer: BC Managed Care – PPO | Attending: Internal Medicine

## 2019-05-26 DIAGNOSIS — Z23 Encounter for immunization: Secondary | ICD-10-CM

## 2019-05-26 NOTE — Progress Notes (Signed)
   Covid-19 Vaccination Clinic  Name:  ADLER ALTON    MRN: [id number] DOB: May 21, 1965  05/26/2019  Ms. Nase was observed post Covid-19 immunization for 15 minutes without incident. She was provided with Vaccine Information Sheet and instruction to access the V-Safe system.   Ms. Thackston was instructed to call 911 with any severe reactions post vaccine: Marland Kitchen Difficulty breathing  . Swelling of face and throat  . A fast heartbeat  . A bad rash all over body  . Dizziness and weakness   Immunizations Administered    Name Date Dose VIS Date Route   Pfizer COVID-19 Vaccine 05/26/2019  8:37 AM 0.3 mL 03/17/2018 Intramuscular   Manufacturer: [employer], [employer]   Lot: [id number]   NDC: [address]-2

## 2019-11-22 ENCOUNTER — Other Ambulatory Visit: Payer: Self-pay | Admitting: Obstetrics and Gynecology

## 2019-11-22 DIAGNOSIS — Z1231 Encounter for screening mammogram for malignant neoplasm of breast: Secondary | ICD-10-CM

## 2019-12-29 ENCOUNTER — Ambulatory Visit: Payer: BC Managed Care – PPO

## 2020-02-08 ENCOUNTER — Ambulatory Visit: Payer: BC Managed Care – PPO

## 2021-11-19 ENCOUNTER — Ambulatory Visit (INDEPENDENT_AMBULATORY_CARE_PROVIDER_SITE_OTHER): Payer: Commercial Managed Care - PPO | Admitting: Neurology

## 2021-11-19 ENCOUNTER — Telehealth: Payer: Self-pay | Admitting: Neurology

## 2021-11-19 ENCOUNTER — Encounter: Payer: Self-pay | Admitting: Neurology

## 2021-11-19 VITALS — BP 113/78 | HR 80 | Ht 68.0 in | Wt 200.0 lb

## 2021-11-19 DIAGNOSIS — G43709 Chronic migraine without aura, not intractable, without status migrainosus: Secondary | ICD-10-CM | POA: Diagnosis not present

## 2021-11-19 DIAGNOSIS — R269 Unspecified abnormalities of gait and mobility: Secondary | ICD-10-CM

## 2021-11-19 DIAGNOSIS — W19XXXS Unspecified fall, sequela: Secondary | ICD-10-CM | POA: Diagnosis not present

## 2021-11-19 DIAGNOSIS — W19XXXA Unspecified fall, initial encounter: Secondary | ICD-10-CM | POA: Insufficient documentation

## 2021-11-19 MED ORDER — SUMATRIPTAN SUCCINATE 50 MG PO TABS: 50.0000 mg | ORAL_TABLET | ORAL | 6 refills | Status: AC | PRN

## 2021-11-19 NOTE — Telephone Encounter (Signed)
45 mins MR cervical spine wo contrast Dr. Estrella Myrtle NPR scheduled at [facility] 11/1 at 9:30am

## 2021-11-19 NOTE — Progress Notes (Signed)
Chief Complaint  Patient presents with   New Patient (Initial Visit)    Rm 15. Alone. NP/Paper/Hayden Jon Billings FNP [facility]/numbness and tingling, bilateral UE and LE.      ASSESSMENT AND PLAN  Meredith [name] is a 56 y.o. female   subacute onset bilateral upper and lower extremity paresthesia, frequent fall,  Hyperreflexia on examination,  Most concerned the possibility of cervical myelopathy  Stat MRI of cervical spine   DIAGNOSTIC DATA (LABS, IMAGING, TESTING) - I reviewed patient records, labs, notes, testing and imaging myself where available.  Labs in Sept 2022  creat 0.89,  TSH,   MEDICAL HISTORY:  Meredith [name], is a 56 year old female seen in request by Unknown Jim at [facility] nurse practitioner Trisha Mangle for evaluation of numbness and tingling of bilateral upper and lower extremities.  I reviewed and summarized the referring note. PMHX. ADHD.  Patient came in with a left wrist splint, complains of increased for since summer 2023, multiple injury  In June 2023, she was stepping up on kitchen stool to reach high cabinet, felt lightheaded, fell to the floor, broke her wrist,  Later she had multiple falling episodes, going down steps, walking down [place] in the store, for no no clear reasons she just fell, recent fall was on November 16, 2021, she broke her left wrist  She denies a history of alcohol use, over the past few months noticed numbness tingling of her fingers and toes, denies bowel bladder incontinence   PHYSICAL EXAM:   Vitals:   11/19/21 0835  BP: 113/78  Pulse: 80  Weight: 200 lb (90.7 kg)  Height: 5\' 8"  (1.727 m)   Not recorded     Body mass index is 30.41 kg/m.  PHYSICAL EXAMNIATION:  Gen: NAD, conversant, well nourised, well groomed                     Cardiovascular: Regular rate rhythm, no peripheral edema, warm, nontender. Eyes: Conjunctivae clear without exudates or hemorrhage Neck: Supple, no carotid  bruits. Pulmonary: Clear to auscultation bilaterally   NEUROLOGICAL EXAM:  MENTAL STATUS: Speech/cognition: Awake, alert, oriented to history taking and casual conversation CRANIAL NERVES: CN II: Visual fields are full to confrontation. Pupils are round equal and briskly reactive to light. CN III, IV, VI: extraocular movement are normal. No ptosis. CN V: Facial sensation is intact to light touch CN VII: Face is symmetric with normal eye closure  CN VIII: Hearing is normal to causal conversation. CN IX, X: Phonation is normal. CN XI: Head turning and shoulder shrug are intact  MOTOR: Left wrist in splint, no significant right upper extremity or bilateral lower extremity weakness  REFLEXES: Reflexes are 3 and symmetric at the biceps, triceps, knees, and ankles. Plantar responses are extensor bilaterally  SENSORY: Intact to light touch, pinprick and vibratory sensation are intact in fingers and toes.  COORDINATION: There is no trunk or limb dysmetria noted.  GAIT/STANCE: Posture is normal. Gait is steady with normal steps,   REVIEW OF SYSTEMS:  Full 14 system review of systems performed and notable only for as above All other review of systems were negative.   ALLERGIES: Allergies  Allergen Reactions   Serotonin Reuptake Inhibitors (Ssris) Other (See Comments)    Anxiety    HOME MEDICATIONS: Current Outpatient Medications  Medication Sig Dispense Refill   amphetamine-dextroamphetamine (ADDERALL) 30 MG tablet Take 30 mg by mouth daily.       amphetamine-dextroamphetamine (ADDERALL) 5  MG tablet Take 5 mg by mouth 2 (two) times daily as needed.     No current facility-administered medications for this visit.    PAST MEDICAL HISTORY: Past Medical History:  Diagnosis Date   ADD (attention deficit disorder with hyperactivity)    maintained on adderall   Anal fissure    Attention deficit disorder of adult    Hemorrhoid thrombosis    surgery by Dr Dalbert Batman    PAST  SURGICAL HISTORY: Past Surgical History:  Procedure Laterality Date   ABDOMINAL HYSTERECTOMY     BLADDER SUSPENSION  09/06/2010   Procedure: TRANSVAGINAL TAPE (TVT) PROCEDURE;  Surgeon: Margarette Asal;  Location: [facility];  Service: Gynecology;  Laterality: Bilateral;  solyx   CESAREAN SECTION     LAPAROSCOPIC ASSISTED VAGINAL HYSTERECTOMY  09/06/2010   Procedure: LAPAROSCOPIC ASSISTED VAGINAL HYSTERECTOMY;  Surgeon: Margarette Asal;  Location: [facility];  Service: Gynecology;  Laterality: Bilateral;  abdomen/vagina   LAPAROSCOPY  2011   diagnostic with lysis of adhesions   LAPAROSCOPY  x2 for in prep for in vitro    FAMILY HISTORY: Family History  Adopted: Yes  Family history unknown: Yes    SOCIAL HISTORY: Social History   Socioeconomic History   Marital status: Married    Spouse name: Not on file   Number of children: 3   Years of education: Not on file   Highest education level: Not on file  Occupational History   Occupation:      Employer: [employer]    Comment: sales, travels  Tobacco Use   Smoking status: Former    Years: 15.00    Types: Cigarettes    Quit date: 09/02/2008    Years since quitting: 13.2   Smokeless tobacco: Never  Vaping Use   Vaping Use: Never used  Substance and Sexual Activity   Alcohol use: No   Drug use: No   Sexual activity: Not on file  Other Topics Concern   Not on file  Social History Narrative   Lives w/husband & 3 children. Works 76 hrs/wk, travels. 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   Social Determinants of Health   Financial Resource Strain: Not on file  Food Insecurity: Not on file  Transportation Needs: Not on file  Physical Activity: Not on file  Stress: Not on file  Social Connections: Not on file  Intimate Partner Violence: Not on file      Marcial Pacas, M.D. Ph.D.  [facility] [address], [place], [place] [address] Ph: [phone] Fax: [phone]  CC:  Joya Gaskins, FNP [address] [place],  [place] [address]  Irene Pap, NP

## 2021-11-21 ENCOUNTER — Ambulatory Visit: Payer: Commercial Managed Care - PPO

## 2021-11-21 DIAGNOSIS — R269 Unspecified abnormalities of gait and mobility: Secondary | ICD-10-CM | POA: Diagnosis not present

## 2021-11-21 DIAGNOSIS — W19XXXS Unspecified fall, sequela: Secondary | ICD-10-CM

## 2021-11-24 ENCOUNTER — Encounter: Payer: Self-pay | Admitting: Neurology

## 2021-11-24 ENCOUNTER — Telehealth: Payer: Self-pay | Admitting: Neurology

## 2021-11-24 DIAGNOSIS — W19XXXS Unspecified fall, sequela: Secondary | ICD-10-CM

## 2021-11-24 DIAGNOSIS — R269 Unspecified abnormalities of gait and mobility: Secondary | ICD-10-CM

## 2021-11-24 NOTE — Telephone Encounter (Signed)
MRI of cervical spine only showed mild abnormality would not explain her frequent fall  I have ordered MRI of the brain, and thoracic spine,  Orders Placed This Encounter  Procedures   MR BRAIN W Lillie

## 2021-12-05 ENCOUNTER — Ambulatory Visit: Payer: Commercial Managed Care - PPO

## 2021-12-05 DIAGNOSIS — W19XXXS Unspecified fall, sequela: Secondary | ICD-10-CM | POA: Diagnosis not present

## 2021-12-05 DIAGNOSIS — R269 Unspecified abnormalities of gait and mobility: Secondary | ICD-10-CM | POA: Diagnosis not present

## 2021-12-05 MED ORDER — GADOBENATE DIMEGLUMINE 529 MG/ML IV SOLN
20.0000 mL | Freq: Once | INTRAVENOUS | Status: AC | PRN
  Administered 2021-12-05: 20 mL via INTRAVENOUS

## 2022-06-04 ENCOUNTER — Other Ambulatory Visit: Payer: Self-pay | Admitting: Obstetrics and Gynecology

## 2022-06-04 DIAGNOSIS — R92333 Mammographic heterogeneous density, bilateral breasts: Secondary | ICD-10-CM

## 2022-07-12 ENCOUNTER — Ambulatory Visit
Admission: RE | Admit: 2022-07-12 | Discharge: 2022-07-12 | Disposition: A | Discharge status: Home / Self Care | Payer: Commercial Managed Care - PPO | Source: Ambulatory Visit | Attending: Obstetrics and Gynecology | Admitting: Obstetrics and Gynecology

## 2022-07-12 DIAGNOSIS — R92333 Mammographic heterogeneous density, bilateral breasts: Secondary | ICD-10-CM

## 2022-07-12 MED ORDER — GADOPICLENOL 0.5 MMOL/ML IV SOLN
6.0000 mL | Freq: Once | INTRAVENOUS | Status: AC | PRN
  Administered 2022-07-12: 6 mL via INTRAVENOUS
